# Patient Record
Sex: Male | Born: 1975
Health system: Southern US, Community
[De-identification: ages and names within clinical notes are randomized; demographics above are authoritative.]

## PROBLEM LIST (undated history)

## (undated) DIAGNOSIS — E785 Hyperlipidemia, unspecified: Secondary | ICD-10-CM

## (undated) DIAGNOSIS — T7840XA Allergy, unspecified, initial encounter: Secondary | ICD-10-CM

## (undated) HISTORY — DX: Hyperlipidemia, unspecified: E78.5

---

## 2004-10-13 ENCOUNTER — Emergency Department: Payer: Self-pay | Admitting: Unknown Physician Specialty

## 2004-10-20 ENCOUNTER — Emergency Department: Payer: Self-pay | Admitting: Emergency Medicine

## 2005-07-02 ENCOUNTER — Ambulatory Visit: Payer: Self-pay

## 2005-08-01 ENCOUNTER — Emergency Department: Payer: Self-pay | Admitting: Emergency Medicine

## 2005-08-02 ENCOUNTER — Inpatient Hospital Stay (HOSPITAL_COMMUNITY): Admission: AD | Admit: 2005-08-02 | Discharge: 2005-08-03 | Payer: Self-pay | Admitting: Surgery

## 2011-07-25 ENCOUNTER — Emergency Department: Payer: Self-pay | Admitting: Emergency Medicine
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
## 2019-08-27 DIAGNOSIS — H524 Presbyopia: Secondary | ICD-10-CM | POA: Diagnosis not present

## 2019-09-23 ENCOUNTER — Ambulatory Visit: Payer: Self-pay

## 2019-09-23 DIAGNOSIS — Z23 Encounter for immunization: Secondary | ICD-10-CM

## 2019-12-30 DIAGNOSIS — D2310 Other benign neoplasm of skin of unspecified eyelid, including canthus: Secondary | ICD-10-CM | POA: Diagnosis not present

## 2020-01-19 DIAGNOSIS — J111 Influenza due to unidentified influenza virus with other respiratory manifestations: Secondary | ICD-10-CM | POA: Diagnosis not present

## 2020-01-19 DIAGNOSIS — Z20822 Contact with and (suspected) exposure to covid-19: Secondary | ICD-10-CM | POA: Diagnosis not present

## 2020-01-19 DIAGNOSIS — R509 Fever, unspecified: Secondary | ICD-10-CM | POA: Diagnosis not present

## 2020-04-13 DIAGNOSIS — D2310 Other benign neoplasm of skin of unspecified eyelid, including canthus: Secondary | ICD-10-CM | POA: Diagnosis not present

## 2020-05-29 ENCOUNTER — Other Ambulatory Visit: Payer: Self-pay | Admitting: Physician Assistant

## 2020-05-29 ENCOUNTER — Other Ambulatory Visit: Payer: Self-pay

## 2020-05-29 DIAGNOSIS — J309 Allergic rhinitis, unspecified: Secondary | ICD-10-CM

## 2020-05-29 MED ORDER — MONTELUKAST SODIUM 10 MG PO TABS
10.0000 mg | ORAL_TABLET | Freq: Every day | ORAL | 0 refills | Status: DC
Start: 2020-05-29 — End: 2021-05-29

## 2020-05-29 MED ORDER — MONTELUKAST SODIUM 10 MG PO TABS: 10.0000 mg | ORAL_TABLET | Freq: Every day | ORAL | 0 refills | Status: DC

## 2020-05-29 MED ORDER — LEVOCETIRIZINE DIHYDROCHLORIDE 5 MG PO TABS: 5.0000 mg | ORAL_TABLET | Freq: Every evening | ORAL | 5 refills | Status: DC

## 2020-05-29 MED ORDER — LEVOCETIRIZINE DIHYDROCHLORIDE 5 MG PO TABS: 5.0000 mg | ORAL_TABLET | Freq: Every evening | ORAL | 0 refills | Status: DC

## 2020-06-12 NOTE — Progress Notes (Signed)
Scheduled to complete physical 06/20/20 with Randel Pigg, PA-C.  AMD

## 2020-06-13 ENCOUNTER — Ambulatory Visit: Payer: Self-pay

## 2020-06-13 ENCOUNTER — Other Ambulatory Visit: Payer: Self-pay

## 2020-06-13 DIAGNOSIS — Z0000 Encounter for general adult medical examination without abnormal findings: Secondary | ICD-10-CM

## 2020-06-13 LAB — POCT URINALYSIS DIPSTICK
Bilirubin, UA: NEGATIVE
Blood, UA: NEGATIVE
Glucose, UA: NEGATIVE
Ketones, UA: NEGATIVE
Leukocytes, UA: NEGATIVE
Nitrite, UA: NEGATIVE
Protein, UA: NEGATIVE
Spec Grav, UA: 1.025 (ref 1.010–1.025)
Urobilinogen, UA: 0.2 E.U./dL
pH, UA: 6 (ref 5.0–8.0)

## 2020-06-14 LAB — CMP12+LP+TP+TSH+6AC+PSA+CBC…
ALT: 24 IU/L (ref 0–44)
AST: 23 IU/L (ref 0–40)
Albumin/Globulin Ratio: 1.7 (ref 1.2–2.2)
Albumin: 4.5 g/dL (ref 4.0–5.0)
Alkaline Phosphatase: 80 IU/L (ref 48–121)
BUN/Creatinine Ratio: 13 (ref 9–20)
BUN: 17 mg/dL (ref 6–24)
Basophils Absolute: 0.1 10*3/uL (ref 0.0–0.2)
Basos: 1 %
Bilirubin Total: 0.4 mg/dL (ref 0.0–1.2)
Calcium: 9.4 mg/dL (ref 8.7–10.2)
Chloride: 105 mmol/L (ref 96–106)
Chol/HDL Ratio: 4.7 ratio (ref 0.0–5.0)
Cholesterol, Total: 242 mg/dL — ABNORMAL HIGH (ref 100–199)
Creatinine, Ser: 1.35 mg/dL — ABNORMAL HIGH (ref 0.76–1.27)
EOS (ABSOLUTE): 0.2 10*3/uL (ref 0.0–0.4)
Eos: 3 %
Estimated CHD Risk: 0.9 times avg. (ref 0.0–1.0)
Free Thyroxine Index: 1.3 (ref 1.2–4.9)
GFR calc Af Amer: 73 mL/min/{1.73_m2} (ref >59)
GFR calc non Af Amer: 63 mL/min/{1.73_m2} (ref >59)
GGT: 25 IU/L (ref 0–65)
Globulin, Total: 2.6 g/dL (ref 1.5–4.5)
Glucose: 99 mg/dL (ref 65–99)
HDL: 52 mg/dL (ref >39)
Hematocrit: 46.2 % (ref 37.5–51.0)
Hemoglobin: 15.7 g/dL (ref 13.0–17.7)
Immature Grans (Abs): 0 10*3/uL (ref 0.0–0.1)
Immature Granulocytes: 0 %
Iron: 112 ug/dL (ref 38–169)
LDH: 254 IU/L — ABNORMAL HIGH (ref 121–224)
LDL Chol Calc (NIH): 173 mg/dL — ABNORMAL HIGH (ref 0–99)
Lymphocytes Absolute: 1.2 10*3/uL (ref 0.7–3.1)
Lymphs: 18 %
MCH: 30.3 pg (ref 26.6–33.0)
MCHC: 34 g/dL (ref 31.5–35.7)
MCV: 89 fL (ref 79–97)
Monocytes Absolute: 0.7 10*3/uL (ref 0.1–0.9)
Monocytes: 11 %
Neutrophils Absolute: 4.3 10*3/uL (ref 1.4–7.0)
Neutrophils: 67 %
Phosphorus: 3.5 mg/dL (ref 2.8–4.1)
Platelets: 213 10*3/uL (ref 150–450)
Potassium: 4.7 mmol/L (ref 3.5–5.2)
Prostate Specific Ag, Serum: 1.1 ng/mL (ref 0.0–4.0)
RBC: 5.19 x10E6/uL (ref 4.14–5.80)
RDW: 14.1 % (ref 11.6–15.4)
Sodium: 140 mmol/L (ref 134–144)
T3 Uptake Ratio: 24 % (ref 24–39)
T4, Total: 5.3 ug/dL (ref 4.5–12.0)
TSH: 1.94 u[IU]/mL (ref 0.450–4.500)
Total Protein: 7.1 g/dL (ref 6.0–8.5)
Triglycerides: 96 mg/dL (ref 0–149)
Uric Acid: 6.4 mg/dL (ref 3.8–8.4)
VLDL Cholesterol Cal: 17 mg/dL (ref 5–40)
WBC: 6.4 10*3/uL (ref 3.4–10.8)

## 2020-06-20 ENCOUNTER — Other Ambulatory Visit: Payer: Self-pay

## 2020-06-20 ENCOUNTER — Encounter: Payer: Self-pay | Admitting: Physician Assistant

## 2020-06-20 ENCOUNTER — Ambulatory Visit: Payer: Self-pay | Admitting: Physician Assistant

## 2020-06-20 VITALS — BP 120/86 | HR 86 | Temp 98.4°F | Resp 16 | Ht 69.0 in | Wt 210.0 lb

## 2020-06-20 DIAGNOSIS — Z0000 Encounter for general adult medical examination without abnormal findings: Secondary | ICD-10-CM

## 2020-06-20 NOTE — Progress Notes (Signed)
   Subjective: Annual physical exam    Patient ID: Connor Rodriguez, male    DOB: August 07, 1976, 44 y.o.   MRN: [id number]  HPI Patient presents for annual physical exam voices no concerns or complaints.   Review of Systems    Seasonal rhinitis Objective:   Physical Exam No acute distress.  BMI is 31.01.  HEENT is unremarkable.  Neck is supple without adenopathy or bruits.  Lungs are clear to auscultation.  Heart regular rate and rhythm.  Abdomen slightly distended, negative HSM, normoactive bowel sounds, soft, and nontender to palpation.  No obvious deformity to the upper or lower extremities.  Patient has full and equal range of motion of the upper and lower extremities.  No cervical or lumbar spine deformity.  Patient has full and equal range of motion of the cervical and lumbar spine.  Cranial nerves II through XII are grossly intact.       Assessment & Plan: Well exam  Discussed lab results with patient advised follow-up as necessary.

## 2020-06-24 ENCOUNTER — Other Ambulatory Visit: Payer: Self-pay | Admitting: Physician Assistant

## 2020-09-13 DIAGNOSIS — H524 Presbyopia: Secondary | ICD-10-CM | POA: Diagnosis not present

## 2020-11-06 DIAGNOSIS — R7301 Impaired fasting glucose: Secondary | ICD-10-CM | POA: Insufficient documentation

## 2020-11-06 DIAGNOSIS — J309 Allergic rhinitis, unspecified: Secondary | ICD-10-CM

## 2020-11-06 DIAGNOSIS — E785 Hyperlipidemia, unspecified: Secondary | ICD-10-CM | POA: Insufficient documentation

## 2020-11-06 HISTORY — DX: Allergic rhinitis, unspecified: J30.9

## 2020-11-08 ENCOUNTER — Ambulatory Visit: Payer: Self-pay

## 2020-11-08 DIAGNOSIS — Z23 Encounter for immunization: Secondary | ICD-10-CM

## 2021-05-28 ENCOUNTER — Telehealth: Payer: Self-pay

## 2021-05-28 NOTE — Telephone Encounter (Signed)
Called pt to change 6/7 appt to a virtual no answer vm full

## 2021-05-29 ENCOUNTER — Telehealth (INDEPENDENT_AMBULATORY_CARE_PROVIDER_SITE_OTHER): Payer: 59 | Admitting: Nurse Practitioner

## 2021-05-29 ENCOUNTER — Other Ambulatory Visit: Payer: Self-pay

## 2021-05-29 ENCOUNTER — Encounter: Payer: Self-pay | Admitting: Nurse Practitioner

## 2021-05-29 VITALS — Ht 69.0 in | Wt 200.0 lb

## 2021-05-29 DIAGNOSIS — G479 Sleep disorder, unspecified: Secondary | ICD-10-CM

## 2021-05-29 DIAGNOSIS — B351 Tinea unguium: Secondary | ICD-10-CM | POA: Diagnosis not present

## 2021-05-29 DIAGNOSIS — R7301 Impaired fasting glucose: Secondary | ICD-10-CM

## 2021-05-29 DIAGNOSIS — Z7689 Persons encountering health services in other specified circumstances: Secondary | ICD-10-CM

## 2021-05-29 DIAGNOSIS — J309 Allergic rhinitis, unspecified: Secondary | ICD-10-CM

## 2021-05-29 DIAGNOSIS — E782 Mixed hyperlipidemia: Secondary | ICD-10-CM | POA: Diagnosis not present

## 2021-05-29 DIAGNOSIS — J302 Other seasonal allergic rhinitis: Secondary | ICD-10-CM | POA: Diagnosis not present

## 2021-05-29 MED ORDER — LEVOCETIRIZINE DIHYDROCHLORIDE 5 MG PO TABS: 5.0000 mg | ORAL_TABLET | Freq: Every evening | ORAL | 0 refills | Status: DC

## 2021-05-29 MED ORDER — MONTELUKAST SODIUM 10 MG PO TABS: 10.0000 mg | ORAL_TABLET | Freq: Every day | ORAL | 0 refills | Status: DC

## 2021-05-29 NOTE — Assessment & Plan Note (Signed)
Refills given. Return to clinic if symptoms worsen.

## 2021-05-29 NOTE — Progress Notes (Signed)
Ht 5\' 9"  (1.753 m)   Wt 200 lb (90.7 kg)   BMI 29.53 kg/m    Subjective:    Patient ID: Connor Rodriguez, male    DOB: [birth year], 45 y.o.   MRN: [id number]  HPI: Connor Rodriguez is a 45 y.o. male  Chief Complaint  Patient presents with  . Establish Care    Pt states she wants to discuss possible sleep apnea, podiatry for possible nail fungus as well    Patient presents to clinic to establish care with new PCP.  Patient reports a history of Impaired fasting glucose, hypercholesterolemia, and seasonal allergies. Patient states he would like to see a podiatrist for possible fungus on his feet.   SLEEP PROBLEM Patient states that he wakes up a couple of times at night gasping for area.  He is concerned that he might have sleep apnea and requesting a sleep study.   Patient denies a history of: Hypertension, Thyroid problems, Depression, Anxiety, Neurological problems, and Abdominal problems.   Denies HA, CP, SOB, dizziness, palpitations, visual changes, and lower extremity swelling.  Relevant past medical, surgical, family and social history reviewed and updated as indicated. Interim medical history since our last visit reviewed. Allergies and medications reviewed and updated.  Review of Systems  Eyes: Negative for visual disturbance.  Respiratory: Negative for shortness of breath.   Cardiovascular: Negative for chest pain and leg swelling.  Skin:       Toenail fungus  Neurological: Negative for light-headedness and headaches.  Psychiatric/Behavioral: Positive for sleep disturbance.    Per HPI unless specifically indicated above     Objective:    Ht 5\' 9"  (1.753 m)   Wt 200 lb (90.7 kg)   BMI 29.53 kg/m   Wt Readings from Last 3 Encounters:  05/29/21 200 lb (90.7 kg)  06/20/20 210 lb (95.3 kg)    Physical Exam Vitals and nursing note reviewed.  Constitutional:      General: He is not in acute distress.    Appearance: He is not ill-appearing.  HENT:     Head:  Normocephalic.     Right Ear: Hearing normal.     Left Ear: Hearing normal.     Nose: Nose normal.  Pulmonary:     Effort: Pulmonary effort is normal. No respiratory distress.  Neurological:     Mental Status: He is alert.  Psychiatric:        Mood and Affect: Mood normal.        Behavior: Behavior normal.        Thought Content: Thought content normal.        Judgment: Judgment normal.     Results for orders placed or performed in visit on 06/13/20  CMP12+LP+TP+TSH+6AC+PSA+CBC.  Result Value Ref Range   Glucose 99 65 - 99 mg/dL   Uric Acid 6.4 3.8 - 8.4 mg/dL   BUN 17 6 - 24 mg/dL   Creatinine, Ser 1.35 (H) 0.76 - 1.27 mg/dL   GFR calc non Af Amer 63 >59 mL/min/1.73   GFR calc Af Amer 73 >59 mL/min/1.73   BUN/Creatinine Ratio 13 9 - 20   Sodium 140 134 - 144 mmol/L   Potassium 4.7 3.5 - 5.2 mmol/L   Chloride 105 96 - 106 mmol/L   Calcium 9.4 8.7 - 10.2 mg/dL   Phosphorus 3.5 2.8 - 4.1 mg/dL   Total Protein 7.1 6.0 - 8.5 g/dL   Albumin 4.5 4.0 - 5.0 g/dL   Globulin, Total 2.6  1.5 - 4.5 g/dL   Albumin/Globulin Ratio 1.7 1.2 - 2.2   Bilirubin Total 0.4 0.0 - 1.2 mg/dL   Alkaline Phosphatase 80 48 - 121 IU/L   LDH 254 (H) 121 - 224 IU/L   AST 23 0 - 40 IU/L   ALT 24 0 - 44 IU/L   GGT 25 0 - 65 IU/L   Iron 112 38 - 169 ug/dL   Cholesterol, Total 242 (H) 100 - 199 mg/dL   Triglycerides 96 0 - 149 mg/dL   HDL 52 >39 mg/dL   VLDL Cholesterol Cal 17 5 - 40 mg/dL   LDL Chol Calc (NIH) 173 (H) 0 - 99 mg/dL   Chol/HDL Ratio 4.7 0.0 - 5.0 ratio   Estimated CHD Risk 0.9 0.0 - 1.0 times avg.   TSH 1.940 0.450 - 4.500 uIU/mL   T4, Total 5.3 4.5 - 12.0 ug/dL   T3 Uptake Ratio 24 24 - 39 %   Free Thyroxine Index 1.3 1.2 - 4.9   Prostate Specific Ag, Serum 1.1 0.0 - 4.0 ng/mL   WBC 6.4 3.4 - 10.8 x10E3/uL   RBC 5.19 4.14 - 5.80 x10E6/uL   Hemoglobin 15.7 13.0 - 17.7 g/dL   Hematocrit 46.2 37.5 - 51.0 %   MCV 89 79 - 97 fL   MCH 30.3 26.6 - 33.0 pg   MCHC 34.0 31.5 - 35.7  g/dL   RDW 14.1 11.6 - 15.4 %   Platelets 213 150 - 450 x10E3/uL   Neutrophils 67 Not Estab. %   Lymphs 18 Not Estab. %   Monocytes 11 Not Estab. %   Eos 3 Not Estab. %   Basos 1 Not Estab. %   Neutrophils Absolute 4.3 1.4 - 7.0 x10E3/uL   Lymphocytes Absolute 1.2 0.7 - 3.1 x10E3/uL   Monocytes Absolute 0.7 0.1 - 0.9 x10E3/uL   EOS (ABSOLUTE) 0.2 0.0 - 0.4 x10E3/uL   Basophils Absolute 0.1 0.0 - 0.2 x10E3/uL   Immature Granulocytes 0 Not Estab. %   Immature Grans (Abs) 0.0 0.0 - 0.1 x10E3/uL  POCT urinalysis dipstick  Result Value Ref Range   Color, UA Yellow    Clarity, UA Clear    Glucose, UA Negative Negative   Bilirubin, UA Negative    Ketones, UA Negative    Spec Grav, UA 1.025 1.010 - 1.025   Blood, UA Negative    pH, UA 6.0 5.0 - 8.0   Protein, UA Negative Negative   Urobilinogen, UA 0.2 0.2 or 1.0 E.U./dL   Nitrite, UA Negative    Leukocytes, UA Negative Negative   Appearance     Odor        Assessment & Plan:   Problem List Items Addressed This Visit      Respiratory   Allergic rhinitis    Refills given. Return to clinic if symptoms worsen.      Relevant Medications   levocetirizine (XYZAL) 5 MG tablet   montelukast (SINGULAIR) 10 MG tablet     Endocrine   Impaired fasting glucose (pre-diabetes)  - Primary    Chronic. Stable. Will daw labs at next visit.         Other   Hyperlipidemia    Chronic. Stable. Will daw labs at next visit.        Other Visit Diagnoses    Sleep disturbance       Order for a sleep study placed. Will make recommendations based on the slepe study.    Relevant Orders  Ambulatory referral to Sleep Studies   Toenail fungus       Referral placed for podiatry.   Relevant Orders   Ambulatory referral to Podiatry   Encounter to establish care       Return in 1 month for a physical and fasting labs.        Follow up plan: Return in about 1 month (around 06/28/2021) for Physical and Fasting labs.    This visit was  completed via MyChart due to the restrictions of the COVID-19 pandemic. All issues as above were discussed and addressed. Physical exam was done as above through visual confirmation on MyChart. If it was felt that the patient should be evaluated in the office, they were directed there. The patient verbally consented to this visit. 1. Location of the patient: Home 2. Location of the provider: Provider's Home 3. Those involved with this call:  ? Provider: Jon Billings, NP ? CMA: Yvonna Alanis, CMA ? Front Desk/Registration: [facility] Side 4. Time spent on call: 20 minutes with patient face to face via video conference. More than 50% of this time was spent in counseling and coordination of care. 30 minutes total spent in review of patient's record and preparation of their chart.

## 2021-05-29 NOTE — Assessment & Plan Note (Signed)
Chronic. Stable. Will daw labs at next visit.

## 2021-06-20 ENCOUNTER — Encounter: Payer: Self-pay | Admitting: Podiatry

## 2021-06-20 ENCOUNTER — Other Ambulatory Visit: Payer: Self-pay

## 2021-06-20 ENCOUNTER — Ambulatory Visit (INDEPENDENT_AMBULATORY_CARE_PROVIDER_SITE_OTHER): Payer: 59 | Admitting: Podiatry

## 2021-06-20 DIAGNOSIS — B351 Tinea unguium: Secondary | ICD-10-CM

## 2021-06-20 DIAGNOSIS — M2141 Flat foot [pes planus] (acquired), right foot: Secondary | ICD-10-CM

## 2021-06-20 DIAGNOSIS — M2142 Flat foot [pes planus] (acquired), left foot: Secondary | ICD-10-CM

## 2021-06-20 DIAGNOSIS — B353 Tinea pedis: Secondary | ICD-10-CM | POA: Diagnosis not present

## 2021-06-20 MED ORDER — KETOCONAZOLE 2 % EX CREA: 1.0000 "application " | TOPICAL_CREAM | Freq: Every day | CUTANEOUS | 2 refills | Status: DC

## 2021-06-20 MED ORDER — TERBINAFINE HCL 250 MG PO TABS: 250.0000 mg | ORAL_TABLET | Freq: Every day | ORAL | 0 refills | Status: AC

## 2021-06-20 NOTE — Progress Notes (Signed)
  Subjective:  Patient ID: Connor Rodriguez, male    DOB: 1976/05/25,  MRN: [id number]  Chief Complaint  Patient presents with   Nail Problem      NP - NAIL FUNGUS    45 y.o. male presents with the above complaint. History confirmed with patient.  Also is interested in arch supports he has [place] also know if they are good for him also has dry scaling skin around his heel  Objective:  Physical Exam: warm, good capillary refill, no trophic changes or ulcerative lesions, normal DP and PT pulses, and normal sensory exam.  He has onychomycosis of multiple toenails most notably the right hallux.  Dry scaling rash on the plantar foot and heel.  He has pes planus. Assessment:   1. Onychomycosis   2. Tinea pedis of both feet   3. Pes planus of both feet      Plan:  Patient was evaluated and treated and all questions answered.  Discussed the etiology and treatment options for tinea pedis and onychomycosis.  Discussed topical and oral treatment.  Recommended topical treatment with 2% ketoconazole cream and Lamisil 90-day course.  This was sent to the patient's pharmacy.  Also discussed appropriate foot hygiene, use of antifungal spray such as Tinactin in shoes, as well as cleaning foot surfaces such as showers and bathroom floors with bleach.  Evaluated is a inserts that he has and I think these are fine right now they are prefabricated deep heel cup arch supports.  When they begin to breakdown he will see [place] for power steps.  Return in about 4 months (around 10/20/2021) for follow up after nail fungus treatment.

## 2021-06-21 ENCOUNTER — Ambulatory Visit (INDEPENDENT_AMBULATORY_CARE_PROVIDER_SITE_OTHER): Payer: 59 | Admitting: Nurse Practitioner

## 2021-06-21 ENCOUNTER — Encounter: Payer: Self-pay | Admitting: Nurse Practitioner

## 2021-06-21 VITALS — BP 130/84 | HR 77 | Temp 97.1°F | Ht 68.0 in | Wt 204.8 lb

## 2021-06-21 DIAGNOSIS — R69 Illness, unspecified: Secondary | ICD-10-CM | POA: Diagnosis not present

## 2021-06-21 DIAGNOSIS — Z114 Encounter for screening for human immunodeficiency virus [HIV]: Secondary | ICD-10-CM

## 2021-06-21 DIAGNOSIS — Z1159 Encounter for screening for other viral diseases: Secondary | ICD-10-CM | POA: Diagnosis not present

## 2021-06-21 DIAGNOSIS — Z0000 Encounter for general adult medical examination without abnormal findings: Secondary | ICD-10-CM | POA: Diagnosis not present

## 2021-06-21 DIAGNOSIS — R7301 Impaired fasting glucose: Secondary | ICD-10-CM

## 2021-06-21 DIAGNOSIS — E782 Mixed hyperlipidemia: Secondary | ICD-10-CM | POA: Diagnosis not present

## 2021-06-21 LAB — URINALYSIS, ROUTINE W REFLEX MICROSCOPIC
Bilirubin, UA: NEGATIVE
Glucose, UA: NEGATIVE
Ketones, UA: NEGATIVE
Leukocytes,UA: NEGATIVE
Nitrite, UA: NEGATIVE
Protein,UA: NEGATIVE
RBC, UA: NEGATIVE
Specific Gravity, UA: 1.025 (ref 1.005–1.030)
Urobilinogen, Ur: 0.2 mg/dL (ref 0.2–1.0)
pH, UA: 5.5 (ref 5.0–7.5)

## 2021-06-21 NOTE — Assessment & Plan Note (Signed)
Chronic.  Controlled. Labs ordered today.  Will make recommendations based on lab results. Return to clinic in 6 months for reevaluation.  Call sooner if concerns arise.

## 2021-06-21 NOTE — Progress Notes (Signed)
BP 130/84   Pulse 77   Temp (!) 97.1 F (36.2 C) (Oral)   Ht 5\' 8"  (1.727 m)   Wt 204 lb 12.8 oz (92.9 kg)   SpO2 99%   BMI 31.14 kg/m    Subjective:    Patient ID: Connor Rodriguez, male    DOB: February 28, 1976, 45 y.o.   MRN: [id number]  HPI: Connor Rodriguez is a 45 y.o. male presenting on 06/21/2021 for comprehensive medical examination. Current medical complaints include:none  He currently lives with: Interim Problems from his last visit: no  HYPERLIPIDEMIA Hyperlipidemia status:  no medication Satisfied with current treatment?  yes Side effects:  no Medication compliance: poor compliance Past cholesterol meds: none Supplements: none Aspirin:  no The 10-year ASCVD risk score [name], et al., 2013) is: 4.7%   Values used to calculate the score:     Age: 79 years     Sex: Male     Is Non-Hispanic African American: Yes     Diabetic: No     Tobacco smoker: No     Systolic Blood Pressure: 115 mmHg     Is BP treated: No     HDL Cholesterol: 52 mg/dL     Total Cholesterol: 242 mg/dL Chest pain:  no Coronary artery disease:  no Family history CAD:  no Family history early CAD:  no   Denies HA, CP, SOB, dizziness, palpitations, visual changes, and lower extremity swelling.   Depression Screen done today and results listed below:  Depression screen [facility] 2/9 06/21/2021 05/29/2021  Decreased Interest 0 0  Down, Depressed, Hopeless 0 0  PHQ - 2 Score 0 0  Altered sleeping 2 -  Tired, decreased energy 1 -  Change in appetite 0 -  Feeling bad or failure about yourself  0 -  Trouble concentrating 0 -  Moving slowly or fidgety/restless 0 -  Suicidal thoughts 0 -  PHQ-9 Score 3 -    The patient does not have a history of falls. I did complete a risk assessment for falls. A plan of care for falls was documented.   Past Medical History:  Past Medical History:  Diagnosis Date   Allergic rhinitis 11/06/2020   Hyperlipidemia     Surgical History:  History reviewed.  No pertinent surgical history.  Medications:  Current Outpatient Medications on File Prior to Visit  Medication Sig   levocetirizine (XYZAL) 5 MG tablet Take 1 tablet (5 mg total) by mouth every evening.   terbinafine (LAMISIL) 250 MG tablet Take 1 tablet (250 mg total) by mouth daily.   ketoconazole (NIZORAL) 2 % cream Apply 1 application topically daily. (Patient not taking: Reported on 06/21/2021)   montelukast (SINGULAIR) 10 MG tablet Take 1 tablet (10 mg total) by mouth daily. (Patient not taking: Reported on 06/21/2021)   No current facility-administered medications on file prior to visit.    Allergies:  No Known Allergies  Social History:  Social History   Socioeconomic History   Marital status: Single    Spouse name: Not on file   Number of children: Not on file   Years of education: Not on file   Highest education level: Not on file  Occupational History   Not on file  Tobacco Use   Smoking status: Never   Smokeless tobacco: Never  Vaping Use   Vaping Use: Never used  Substance and Sexual Activity   Alcohol use: Not Currently   Drug use: Never   Sexual activity:  Yes  Other Topics Concern   Not on file  Social History Narrative   Not on file   Social Determinants of Health   Financial Resource Strain: Not on file  Food Insecurity: Not on file  Transportation Needs: Not on file  Physical Activity: Not on file  Stress: Not on file  Social Connections: Not on file  Intimate Partner Violence: Not on file   Social History   Tobacco Use  Smoking Status Never  Smokeless Tobacco Never   Social History   Substance and Sexual Activity  Alcohol Use Not Currently    Family History:  Family History  Problem Relation Age of Onset   Cancer Mother    Diabetes Father     Past medical history, surgical history, medications, allergies, family history and social history reviewed with patient today and changes made to appropriate areas of the chart.   Review  of Systems  Eyes:  Negative for blurred vision and double vision.  Respiratory:  Negative for shortness of breath.   Cardiovascular:  Negative for chest pain, palpitations and leg swelling.  Neurological:  Negative for dizziness and headaches.  All other ROS negative except what is listed above and in the HPI.      Objective:    BP 130/84   Pulse 77   Temp (!) 97.1 F (36.2 C) (Oral)   Ht 5\' 8"  (1.727 m)   Wt 204 lb 12.8 oz (92.9 kg)   SpO2 99%   BMI 31.14 kg/m   Wt Readings from Last 3 Encounters:  06/21/21 204 lb 12.8 oz (92.9 kg)  05/29/21 200 lb (90.7 kg)  06/20/20 210 lb (95.3 kg)    Physical Exam Vitals and nursing note reviewed.  Constitutional:      General: He is not in acute distress.    Appearance: Normal appearance. He is not ill-appearing, toxic-appearing or diaphoretic.  HENT:     Head: Normocephalic.     Right Ear: Tympanic membrane, ear canal and external ear normal.     Left Ear: Tympanic membrane, ear canal and external ear normal.     Nose: Nose normal. No congestion or rhinorrhea.     Mouth/Throat:     Mouth: Mucous membranes are moist.  Eyes:     General:        Right eye: No discharge.        Left eye: No discharge.     Extraocular Movements: Extraocular movements intact.     Conjunctiva/sclera: Conjunctivae normal.     Pupils: Pupils are equal, round, and reactive to light.  Cardiovascular:     Rate and Rhythm: Normal rate and regular rhythm.     Heart sounds: No murmur heard. Pulmonary:     Effort: Pulmonary effort is normal. No respiratory distress.     Breath sounds: Normal breath sounds. No wheezing, rhonchi or rales.  Abdominal:     General: Abdomen is flat. Bowel sounds are normal. There is no distension.     Palpations: Abdomen is soft.     Tenderness: There is no abdominal tenderness. There is no guarding.  Musculoskeletal:     Cervical back: Normal range of motion and neck supple.  Skin:    General: Skin is warm and dry.      Capillary Refill: Capillary refill takes less than 2 seconds.  Neurological:     General: No focal deficit present.     Mental Status: He is alert and oriented to person, place, and time.  Cranial Nerves: No cranial nerve deficit.     Motor: No weakness.     Deep Tendon Reflexes: Reflexes normal.  Psychiatric:        Mood and Affect: Mood normal.        Behavior: Behavior normal.        Thought Content: Thought content normal.        Judgment: Judgment normal.    Results for orders placed or performed in visit on 06/13/20  CMP12+LP+TP+TSH+6AC+PSA+CBC.  Result Value Ref Range   Glucose 99 65 - 99 mg/dL   Uric Acid 6.4 3.8 - 8.4 mg/dL   BUN 17 6 - 24 mg/dL   Creatinine, Ser 1.35 (H) 0.76 - 1.27 mg/dL   GFR calc non Af Amer 63 >59 mL/min/1.73   GFR calc Af Amer 73 >59 mL/min/1.73   BUN/Creatinine Ratio 13 9 - 20   Sodium 140 134 - 144 mmol/L   Potassium 4.7 3.5 - 5.2 mmol/L   Chloride 105 96 - 106 mmol/L   Calcium 9.4 8.7 - 10.2 mg/dL   Phosphorus 3.5 2.8 - 4.1 mg/dL   Total Protein 7.1 6.0 - 8.5 g/dL   Albumin 4.5 4.0 - 5.0 g/dL   Globulin, Total 2.6 1.5 - 4.5 g/dL   Albumin/Globulin Ratio 1.7 1.2 - 2.2   Bilirubin Total 0.4 0.0 - 1.2 mg/dL   Alkaline Phosphatase 80 48 - 121 IU/L   LDH 254 (H) 121 - 224 IU/L   AST 23 0 - 40 IU/L   ALT 24 0 - 44 IU/L   GGT 25 0 - 65 IU/L   Iron 112 38 - 169 ug/dL   Cholesterol, Total 242 (H) 100 - 199 mg/dL   Triglycerides 96 0 - 149 mg/dL   HDL 52 >39 mg/dL   VLDL Cholesterol Cal 17 5 - 40 mg/dL   LDL Chol Calc (NIH) 173 (H) 0 - 99 mg/dL   Chol/HDL Ratio 4.7 0.0 - 5.0 ratio   Estimated CHD Risk 0.9 0.0 - 1.0 times avg.   TSH 1.940 0.450 - 4.500 uIU/mL   T4, Total 5.3 4.5 - 12.0 ug/dL   T3 Uptake Ratio 24 24 - 39 %   Free Thyroxine Index 1.3 1.2 - 4.9   Prostate Specific Ag, Serum 1.1 0.0 - 4.0 ng/mL   WBC 6.4 3.4 - 10.8 x10E3/uL   RBC 5.19 4.14 - 5.80 x10E6/uL   Hemoglobin 15.7 13.0 - 17.7 g/dL   Hematocrit 46.2 37.5 - 51.0 %    MCV 89 79 - 97 fL   MCH 30.3 26.6 - 33.0 pg   MCHC 34.0 31.5 - 35.7 g/dL   RDW 14.1 11.6 - 15.4 %   Platelets 213 150 - 450 x10E3/uL   Neutrophils 67 Not Estab. %   Lymphs 18 Not Estab. %   Monocytes 11 Not Estab. %   Eos 3 Not Estab. %   Basos 1 Not Estab. %   Neutrophils Absolute 4.3 1.4 - 7.0 x10E3/uL   Lymphocytes Absolute 1.2 0.7 - 3.1 x10E3/uL   Monocytes Absolute 0.7 0.1 - 0.9 x10E3/uL   EOS (ABSOLUTE) 0.2 0.0 - 0.4 x10E3/uL   Basophils Absolute 0.1 0.0 - 0.2 x10E3/uL   Immature Granulocytes 0 Not Estab. %   Immature Grans (Abs) 0.0 0.0 - 0.1 x10E3/uL  POCT urinalysis dipstick  Result Value Ref Range   Color, UA Yellow    Clarity, UA Clear    Glucose, UA Negative Negative   Bilirubin, UA Negative  Ketones, UA Negative    Spec Grav, UA 1.025 1.010 - 1.025   Blood, UA Negative    pH, UA 6.0 5.0 - 8.0   Protein, UA Negative Negative   Urobilinogen, UA 0.2 0.2 or 1.0 E.U./dL   Nitrite, UA Negative    Leukocytes, UA Negative Negative   Appearance     Odor        Assessment & Plan:   Problem List Items Addressed This Visit       Endocrine   Impaired fasting glucose (pre-diabetes)     Chronic.  Controlled. Labs ordered today.  Will make recommendations based on lab results. Return to clinic in 6 months for reevaluation.  Call sooner if concerns arise.         Relevant Orders   HgB A1c     Other   Hyperlipidemia    Chronic.  Controlled. Labs ordered today.  Will make recommendations based on lab results. Return to clinic in 6 months for reevaluation.  Call sooner if concerns arise.        Relevant Orders   Lipid panel   Other Visit Diagnoses     Annual physical exam    -  Primary   Health maintenance reviewed during visit. Up to date on vaccines. Will wait on Colonoscopy due to insurance coverage.    Relevant Orders   TSH   PSA   Lipid panel   CBC with Differential/Platelet   Comprehensive metabolic panel   Urinalysis, Routine w reflex  microscopic   Encounter for hepatitis C screening test for low risk patient       Relevant Orders   Hepatitis C Antibody   Screening for HIV (human immunodeficiency virus)       Relevant Orders   HIV Antibody (routine testing w rflx)        Discussed aspirin prophylaxis for myocardial infarction prevention and decision was it was not indicated  LABORATORY TESTING:  Health maintenance labs ordered today as discussed above.   The natural history of prostate cancer and ongoing controversy regarding screening and potential treatment outcomes of prostate cancer has been discussed with the patient. The meaning of a false positive PSA and a false negative PSA has been discussed. He indicates understanding of the limitations of this screening test and wishes to proceed with screening PSA testing.   IMMUNIZATIONS:   - Tdap: Tetanus vaccination status reviewed: last tetanus booster within 10 years. - Influenza: Postponed to flu season - Pneumovax: Not applicable - Prevnar: Not applicable - HPV: Not applicable - Zostavax vaccine: Not applicable  SCREENING: - Colonoscopy:  will discuss at 48 due to insurance reasons   Discussed with patient purpose of the colonoscopy is to detect colon cancer at curable precancerous or early stages   - AAA Screening: Not applicable  -Hearing Test: Not applicable  -Spirometry: Not applicable   PATIENT COUNSELING:    Sexuality: Discussed sexually transmitted diseases, partner selection, use of condoms, avoidance of unintended pregnancy  and contraceptive alternatives.   Advised to avoid cigarette smoking.  I discussed with the patient that most people either abstain from alcohol or drink within safe limits (<=14/week and <=4 drinks/occasion for males, <=7/weeks and <= 3 drinks/occasion for females) and that the risk for alcohol disorders and other health effects rises proportionally with the number of drinks per week and how often a drinker exceeds daily  limits.  Discussed cessation/primary prevention of drug use and availability of treatment for abuse.   Diet:  Encouraged to adjust caloric intake to maintain  or achieve ideal body weight, to reduce intake of dietary saturated fat and total fat, to limit sodium intake by avoiding high sodium foods and not adding table salt, and to maintain adequate dietary potassium and calcium preferably from fresh fruits, vegetables, and low-fat dairy products.    stressed the importance of regular exercise  Injury prevention: Discussed safety belts, safety helmets, smoke detector, smoking near bedding or upholstery.   Dental health: Discussed importance of regular tooth brushing, flossing, and dental visits.   Follow up plan: NEXT PREVENTATIVE PHYSICAL DUE IN 1 YEAR. Return in about 6 months (around 12/21/2021) for HTN, HLD, DM2 FU.

## 2021-06-22 ENCOUNTER — Encounter: Payer: Self-pay | Admitting: Podiatry

## 2021-06-22 LAB — COMPREHENSIVE METABOLIC PANEL
ALT: 25 IU/L (ref 0–44)
AST: 22 IU/L (ref 0–40)
Albumin/Globulin Ratio: 1.7 (ref 1.2–2.2)
Albumin: 4 g/dL (ref 4.0–5.0)
Alkaline Phosphatase: 91 IU/L (ref 44–121)
BUN/Creatinine Ratio: 11 (ref 9–20)
BUN: 14 mg/dL (ref 6–24)
Bilirubin Total: <0.2 mg/dL (ref 0.0–1.2)
CO2: 24 mmol/L (ref 20–29)
Calcium: 9.2 mg/dL (ref 8.7–10.2)
Chloride: 101 mmol/L (ref 96–106)
Creatinine, Ser: 1.29 mg/dL — ABNORMAL HIGH (ref 0.76–1.27)
Globulin, Total: 2.3 g/dL (ref 1.5–4.5)
Glucose: 66 mg/dL (ref 65–99)
Potassium: 4.1 mmol/L (ref 3.5–5.2)
Sodium: 138 mmol/L (ref 134–144)
Total Protein: 6.3 g/dL (ref 6.0–8.5)
eGFR: 69 mL/min/{1.73_m2} (ref >59)

## 2021-06-22 LAB — LIPID PANEL
Chol/HDL Ratio: 4.1 ratio (ref 0.0–5.0)
Cholesterol, Total: 201 mg/dL — ABNORMAL HIGH (ref 100–199)
HDL: 49 mg/dL (ref >39)
LDL Chol Calc (NIH): 132 mg/dL — ABNORMAL HIGH (ref 0–99)
Triglycerides: 114 mg/dL (ref 0–149)
VLDL Cholesterol Cal: 20 mg/dL (ref 5–40)

## 2021-06-22 LAB — CBC WITH DIFFERENTIAL/PLATELET
Basophils Absolute: 0.1 10*3/uL (ref 0.0–0.2)
Basos: 1 %
EOS (ABSOLUTE): 0.2 10*3/uL (ref 0.0–0.4)
Eos: 3 %
Hematocrit: 43.4 % (ref 37.5–51.0)
Hemoglobin: 14.3 g/dL (ref 13.0–17.7)
Immature Grans (Abs): 0.1 10*3/uL (ref 0.0–0.1)
Immature Granulocytes: 1 %
Lymphocytes Absolute: 1.4 10*3/uL (ref 0.7–3.1)
Lymphs: 18 %
MCH: 30 pg (ref 26.6–33.0)
MCHC: 32.9 g/dL (ref 31.5–35.7)
MCV: 91 fL (ref 79–97)
Monocytes Absolute: 0.7 10*3/uL (ref 0.1–0.9)
Monocytes: 9 %
Neutrophils Absolute: 5.3 10*3/uL (ref 1.4–7.0)
Neutrophils: 68 %
Platelets: 239 10*3/uL (ref 150–450)
RBC: 4.77 x10E6/uL (ref 4.14–5.80)
RDW: 13.7 % (ref 11.6–15.4)
WBC: 7.7 10*3/uL (ref 3.4–10.8)

## 2021-06-22 LAB — PSA: Prostate Specific Ag, Serum: 1.3 ng/mL (ref 0.0–4.0)

## 2021-06-22 LAB — HEMOGLOBIN A1C
Est. average glucose Bld gHb Est-mCnc: 117 mg/dL
Hgb A1c MFr Bld: 5.7 % — ABNORMAL HIGH (ref 4.8–5.6)

## 2021-06-22 LAB — HEPATITIS C ANTIBODY: Hep C Virus Ab: <0.1 s/co ratio (ref 0.0–0.9)

## 2021-06-22 LAB — TSH: TSH: 0.676 u[IU]/mL (ref 0.450–4.500)

## 2021-06-22 LAB — HIV ANTIBODY (ROUTINE TESTING W REFLEX): HIV Screen 4th Generation wRfx: NONREACTIVE

## 2021-06-26 NOTE — Progress Notes (Signed)
Please let patient know that overall his lab work looks good.  Cholesterol has improved from 1 year ago.  Keep up the good work.  A1c remains well controlled at 5.7.    His kidney function is slightly elevated. I would like to repeat this next week to see if it remains elevated.  If so, we may need to see nephrology to find out why it is elevated.  I have placed the order for the repeat labs. Please make patient a lab appointment.

## 2021-06-26 NOTE — Addendum Note (Signed)
Addended by: Jon Billings on: 06/26/2021 08:13 AM   Modules accepted: Orders

## 2021-07-04 ENCOUNTER — Encounter: Payer: 59 | Admitting: Nurse Practitioner

## 2021-07-10 ENCOUNTER — Other Ambulatory Visit: Payer: Self-pay

## 2021-07-10 ENCOUNTER — Other Ambulatory Visit: Payer: 59

## 2021-07-10 DIAGNOSIS — Z0000 Encounter for general adult medical examination without abnormal findings: Secondary | ICD-10-CM

## 2021-07-10 DIAGNOSIS — R7301 Impaired fasting glucose: Secondary | ICD-10-CM | POA: Diagnosis not present

## 2021-07-10 DIAGNOSIS — E782 Mixed hyperlipidemia: Secondary | ICD-10-CM

## 2021-07-11 LAB — COMPREHENSIVE METABOLIC PANEL
ALT: 14 IU/L (ref 0–44)
AST: 15 IU/L (ref 0–40)
Albumin/Globulin Ratio: 1.7 (ref 1.2–2.2)
Albumin: 4.1 g/dL (ref 4.0–5.0)
Alkaline Phosphatase: 84 IU/L (ref 44–121)
BUN/Creatinine Ratio: 10 (ref 9–20)
BUN: 12 mg/dL (ref 6–24)
Bilirubin Total: 0.2 mg/dL (ref 0.0–1.2)
CO2: 24 mmol/L (ref 20–29)
Calcium: 9 mg/dL (ref 8.7–10.2)
Chloride: 103 mmol/L (ref 96–106)
Creatinine, Ser: 1.24 mg/dL (ref 0.76–1.27)
Globulin, Total: 2.4 g/dL (ref 1.5–4.5)
Glucose: 98 mg/dL (ref 65–99)
Potassium: 4 mmol/L (ref 3.5–5.2)
Sodium: 139 mmol/L (ref 134–144)
Total Protein: 6.5 g/dL (ref 6.0–8.5)
eGFR: 73 mL/min/{1.73_m2} (ref >59)

## 2021-07-11 NOTE — Progress Notes (Signed)
Hi Connor Rodriguez.  Your kidney function returned to normal.  This is great news.  We will continue to monitor this in the future.

## 2021-08-26 ENCOUNTER — Other Ambulatory Visit: Payer: Self-pay | Admitting: Nurse Practitioner

## 2021-08-26 DIAGNOSIS — J309 Allergic rhinitis, unspecified: Secondary | ICD-10-CM

## 2021-08-26 NOTE — Telephone Encounter (Signed)
Requested Prescriptions  Pending Prescriptions Disp Refills  . montelukast (SINGULAIR) 10 MG tablet [Pharmacy Med Name: MONTELUKAST SOD 10 MG TABLET] 90 tablet 0    Sig: TAKE 1 TABLET BY MOUTH EVERY DAY     Pulmonology:  Leukotriene Inhibitors Passed - 08/26/2021  9:06 AM      Passed - Valid encounter within last 12 months    Recent Outpatient Visits          2 months ago Annual physical exam   [place], NP   2 months ago Impaired fasting glucose (pre-diabetes)   [place], NP      Future Appointments            In 3 months Jon Billings, NP [facility], [facility]           . levocetirizine (XYZAL) 5 MG tablet [Pharmacy Med Name: LEVOCETIRIZINE 5 MG TABLET] 90 tablet 0    Sig: TAKE 1 TABLET BY MOUTH EVERY DAY IN THE EVENING     Ear, Nose, and Throat:  Antihistamines Passed - 08/26/2021  9:06 AM      Passed - Valid encounter within last 12 months    Recent Outpatient Visits          2 months ago Annual physical exam   [place], NP   2 months ago Impaired fasting glucose (pre-diabetes)   [facility] Jon Billings, NP      Future Appointments            In 3 months Jon Billings, NP [facility], [place]

## 2021-10-22 ENCOUNTER — Encounter: Payer: 59 | Admitting: Podiatry

## 2021-12-21 ENCOUNTER — Ambulatory Visit: Payer: 59 | Admitting: Nurse Practitioner

## 2022-01-11 ENCOUNTER — Ambulatory Visit (INDEPENDENT_AMBULATORY_CARE_PROVIDER_SITE_OTHER): Payer: 59 | Admitting: Nurse Practitioner

## 2022-01-11 ENCOUNTER — Encounter: Payer: Self-pay | Admitting: Nurse Practitioner

## 2022-01-11 ENCOUNTER — Other Ambulatory Visit: Payer: Self-pay

## 2022-01-11 VITALS — BP 124/78 | HR 81 | Temp 98.3°F | Ht 68.3 in | Wt 212.8 lb

## 2022-01-11 DIAGNOSIS — R7301 Impaired fasting glucose: Secondary | ICD-10-CM

## 2022-01-11 DIAGNOSIS — E782 Mixed hyperlipidemia: Secondary | ICD-10-CM | POA: Diagnosis not present

## 2022-01-11 NOTE — Assessment & Plan Note (Signed)
Labs ordered today.  Will make recommendations based on lab results. ?

## 2022-01-11 NOTE — Assessment & Plan Note (Signed)
Labs ordered today. Will make recommendations based on lab results.  Patient not currently on cholesterol medication.

## 2022-01-11 NOTE — Progress Notes (Signed)
BP 124/78    Pulse 81    Temp 98.3 F (36.8 C) (Oral)    Ht 5' 8.3" (1.735 m)    Wt 212 lb 12.8 oz (96.5 kg)    SpO2 97%    BMI 32.07 kg/m    Subjective:    Patient ID: Connor Rodriguez, male    DOB: 03-Jan-1976, 46 y.o.   MRN: [id number]  HPI: Connor Rodriguez is a 46 y.o. male  Chief Complaint  Patient presents with   Diabetes   Hyperlipidemia   Hypertension   HYPERLIPIDEMIA Hyperlipidemia status: no medication Satisfied with current treatment?  yes Side effects:  no Medication compliance: Not taking any medication Past cholesterol meds: none Supplements: none Aspirin:  no The 10-year ASCVD risk score Connor Rodriguez) is: 4.7%   Values used to calculate the score:     Age: 77 years     Sex: Male     Is Non-Hispanic African American: Yes     Diabetic: No     Tobacco smoker: No     Systolic Blood Pressure: 858 mmHg     Is BP treated: No     HDL Cholesterol: 52 mg/dL     Total Cholesterol: 242 mg/dL Chest pain:  no Coronary artery disease:  no Family history CAD:  no Family history early CAD:  no   Denies HA, CP, SOB, dizziness, palpitations, visual changes, and lower extremity swelling. Relevant past medical, surgical, family and social history reviewed and updated as indicated. Interim medical history since our last visit reviewed. Allergies and medications reviewed and updated.  Review of Systems  Eyes:  Negative for visual disturbance.  Respiratory:  Negative for chest tightness and shortness of breath.   Cardiovascular:  Negative for chest pain, palpitations and leg swelling.  Neurological:  Negative for dizziness, light-headedness and headaches.   Per HPI unless specifically indicated above     Objective:    BP 124/78    Pulse 81    Temp 98.3 F (36.8 C) (Oral)    Ht 5' 8.3" (1.735 m)    Wt 212 lb 12.8 oz (96.5 kg)    SpO2 97%    BMI 32.07 kg/m   Wt Readings from Last 3 Encounters:  01/11/22 212 lb 12.8 oz (96.5 kg)  06/21/21 204 lb 12.8 oz  (92.9 kg)  05/29/21 200 lb (90.7 kg)    Physical Exam Vitals and nursing note reviewed.  Constitutional:      General: He is not in acute distress.    Appearance: Normal appearance. He is not ill-appearing, toxic-appearing or diaphoretic.  HENT:     Head: Normocephalic.     Right Ear: External ear normal.     Left Ear: External ear normal.     Nose: Nose normal. No congestion or rhinorrhea.     Mouth/Throat:     Mouth: Mucous membranes are moist.  Eyes:     General:        Right eye: No discharge.        Left eye: No discharge.     Extraocular Movements: Extraocular movements intact.     Conjunctiva/sclera: Conjunctivae normal.     Pupils: Pupils are equal, round, and reactive to light.  Cardiovascular:     Rate and Rhythm: Normal rate and regular rhythm.     Heart sounds: No murmur heard. Pulmonary:     Effort: Pulmonary effort is normal. No respiratory distress.  sounds: Normal breath sounds. No wheezing, rhonchi or rales.  °Abdominal:  °   General: Abdomen is flat. Bowel sounds are normal.  °Musculoskeletal:  °   Cervical back: Normal range of motion and neck supple.  °Skin: °   General: Skin is warm and dry.  °   Capillary Refill: Capillary refill takes less than 2 seconds.  °Neurological:  °   General: No focal deficit present.  °   Mental Status: He is alert and oriented to person, place, and time.  °Psychiatric:     °   Mood and Affect: Mood normal.     °   Behavior: Behavior normal.     °   Thought Content: Thought content normal.     °   Judgment: Judgment normal.  ° ° °Results for orders placed or performed in visit on 07/10/21  °Comp Met (CMET)  °Result Value Ref Range  ° Glucose 98 65 - 99 mg/dL  ° BUN 12 6 - 24 mg/dL  ° Creatinine, Ser 1.24 0.76 - 1.27 mg/dL  ° eGFR 73 >59 mL/min/1.73  ° BUN/Creatinine Ratio 10 9 - 20  ° Sodium 139 134 - 144 mmol/L  ° Potassium 4.0 3.5 - 5.2 mmol/L  ° Chloride 103 96 - 106 mmol/L  ° CO2 24 20 - 29 mmol/L  ° Calcium 9.0 8.7 - 10.2  mg/dL  ° Total Protein 6.5 6.0 - 8.5 g/dL  ° Albumin 4.1 4.0 - 5.0 g/dL  ° Globulin, Total 2.4 1.5 - 4.5 g/dL  ° Albumin/Globulin Ratio 1.7 1.2 - 2.2  ° Bilirubin Total 0.2 0.0 - 1.2 mg/dL  ° Alkaline Phosphatase 84 44 - 121 IU/L  ° AST 15 0 - 40 IU/L  ° ALT 14 0 - 44 IU/L  ° °   °Assessment & Plan:  ° °Problem List Items Addressed This Visit   ° °  ° Endocrine  ° Impaired fasting glucose (pre-diabetes)   °  Labs ordered today. Will make recommendations based on lab results.  °  °  ° Relevant Orders  ° Comp Met (CMET)  ° HgB A1c  °  ° Other  ° Hyperlipidemia - Primary  °  Labs ordered today. Will make recommendations based on lab results.  Patient not currently on cholesterol medication.  °  °  ° Relevant Orders  ° Comp Met (CMET)  ° Lipid Profile  °  ° °Follow up plan: °Return in about 6 months (around 07/11/2022) for Physical and Fasting labs. ° ° ° ° ° ° °

## 2022-01-12 LAB — COMPREHENSIVE METABOLIC PANEL
ALT: 26 IU/L (ref 0–44)
AST: 20 IU/L (ref 0–40)
Albumin/Globulin Ratio: 1.6 (ref 1.2–2.2)
Albumin: 4.2 g/dL (ref 4.0–5.0)
Alkaline Phosphatase: 81 IU/L (ref 44–121)
BUN/Creatinine Ratio: 10 (ref 9–20)
BUN: 12 mg/dL (ref 6–24)
Bilirubin Total: 0.2 mg/dL (ref 0.0–1.2)
CO2: 25 mmol/L (ref 20–29)
Calcium: 9.5 mg/dL (ref 8.7–10.2)
Chloride: 105 mmol/L (ref 96–106)
Creatinine, Ser: 1.23 mg/dL (ref 0.76–1.27)
Globulin, Total: 2.6 g/dL (ref 1.5–4.5)
Glucose: 93 mg/dL (ref 70–99)
Potassium: 5.3 mmol/L — ABNORMAL HIGH (ref 3.5–5.2)
Sodium: 143 mmol/L (ref 134–144)
Total Protein: 6.8 g/dL (ref 6.0–8.5)
eGFR: 73 mL/min/{1.73_m2} (ref >59)

## 2022-01-12 LAB — HEMOGLOBIN A1C
Est. average glucose Bld gHb Est-mCnc: 123 mg/dL
Hgb A1c MFr Bld: 5.9 % — ABNORMAL HIGH (ref 4.8–5.6)

## 2022-01-12 LAB — LIPID PANEL
Chol/HDL Ratio: 5.3 ratio — ABNORMAL HIGH (ref 0.0–5.0)
Cholesterol, Total: 227 mg/dL — ABNORMAL HIGH (ref 100–199)
HDL: 43 mg/dL
LDL Chol Calc (NIH): 145 mg/dL — ABNORMAL HIGH (ref 0–99)
Triglycerides: 215 mg/dL — ABNORMAL HIGH (ref 0–149)
VLDL Cholesterol Cal: 39 mg/dL (ref 5–40)

## 2022-01-14 NOTE — Progress Notes (Signed)
Please let patient know his lab work shows that his cholesterol is elevated from prior.  Recommend a low fat diet and exercise.  Please let me know if he has any questions.  We will continue to monitor this in the future.

## 2022-01-21 ENCOUNTER — Encounter: Payer: Self-pay | Admitting: Nurse Practitioner

## 2022-01-21 ENCOUNTER — Ambulatory Visit (INDEPENDENT_AMBULATORY_CARE_PROVIDER_SITE_OTHER): Payer: 59 | Admitting: Nurse Practitioner

## 2022-01-21 ENCOUNTER — Other Ambulatory Visit: Payer: Self-pay

## 2022-01-21 VITALS — BP 129/80 | HR 78 | Temp 98.3°F | Ht 68.31 in | Wt 219.8 lb

## 2022-01-21 DIAGNOSIS — Z1211 Encounter for screening for malignant neoplasm of colon: Secondary | ICD-10-CM | POA: Diagnosis not present

## 2022-01-21 DIAGNOSIS — J302 Other seasonal allergic rhinitis: Secondary | ICD-10-CM | POA: Diagnosis not present

## 2022-01-21 NOTE — Progress Notes (Signed)
° °BP 129/80    Pulse 78    Temp 98.3 °F (36.8 °C) (Oral)    Ht 5' 8.31" (1.735 m)    Wt 219 lb 12.8 oz (99.7 kg)    SpO2 98%    BMI 33.12 kg/m²   ° °Subjective:  ° ° Patient ID: Connor Rodriguez, male    DOB: 07/16/1976, 45 y.o.   MRN: [id number] ° °HPI: °Connor Rodriguez is a 45 y.o. male ° °Chief Complaint  °Patient presents with  ° Colonoscopy  °  Patient wants to talk about getting a colonoscopy. Patient is trying to proactive.  ° ° °Patient presents to clinic to discuss getting a colonoscopy.  Patient is ready to get it done. Patient does not have a family history colon cancer.  Patient denies concerns just wants to be proactive. ° °Patient sates his allergies are well controlled on singulair.  Denies concerns at visit today.  ° ° Denies HA, CP, SOB, dizziness, palpitations, visual changes, and lower extremity swelling. ° ° °Relevant past medical, surgical, family and social history reviewed and updated as indicated. Interim medical history since our last visit reviewed. °Allergies and medications reviewed and updated. ° °Review of Systems  °Eyes:  Negative for visual disturbance.  °Respiratory:  Negative for shortness of breath.   °Cardiovascular:  Negative for chest pain and leg swelling.  °Neurological:  Negative for light-headedness and headaches.  ° °Per HPI unless specifically indicated above ° °   °Objective:  °  °BP 129/80    Pulse 78    Temp 98.3 °F (36.8 °C) (Oral)    Ht 5' 8.31" (1.735 m)    Wt 219 lb 12.8 oz (99.7 kg)    SpO2 98%    BMI 33.12 kg/m²   °Wt Readings from Last 3 Encounters:  °01/21/22 219 lb 12.8 oz (99.7 kg)  °01/11/22 212 lb 12.8 oz (96.5 kg)  °06/21/21 204 lb 12.8 oz (92.9 kg)  °  °Physical Exam °Vitals and nursing note reviewed.  °Constitutional:   °   General: He is not in acute distress. °   Appearance: Normal appearance. He is not ill-appearing, toxic-appearing or diaphoretic.  °HENT:  °   Head: Normocephalic.  °   Right Ear: External ear normal.  °   Left Ear: External ear normal.   °   Nose: Nose normal. No congestion or rhinorrhea.  °   Mouth/Throat:  °   Mouth: Mucous membranes are moist.  °Eyes:  °   General:     °   Right eye: No discharge.     °   Left eye: No discharge.  °   Extraocular Movements: Extraocular movements intact.  °   Conjunctiva/sclera: Conjunctivae normal.  °   Pupils: Pupils are equal, round, and reactive to light.  °Cardiovascular:  °   Rate and Rhythm: Normal rate and regular rhythm.  °   Heart sounds: No murmur heard. °Pulmonary:  °   Effort: Pulmonary effort is normal. No respiratory distress.  °   Breath sounds: Normal breath sounds. No wheezing, rhonchi or rales.  °Abdominal:  °   General: Abdomen is flat. Bowel sounds are normal.  °Musculoskeletal:  °   Cervical back: Normal range of motion and neck supple.  °Skin: °   General: Skin is warm and dry.  °   Capillary Refill: Capillary refill takes less than 2 seconds.  °Neurological:  °   General: No focal deficit present.  °     Mental Status: He is alert and oriented to person, place, and time.  Psychiatric:        Mood and Affect: Mood normal.        Behavior: Behavior normal.        Thought Content: Thought content normal.        Judgment: Judgment normal.    Results for orders placed or performed in visit on 01/11/22  Comp Met (CMET)  Result Value Ref Range   Glucose 93 70 - 99 mg/dL   BUN 12 6 - 24 mg/dL   Creatinine, Ser 1.23 0.76 - 1.27 mg/dL   eGFR 73 >59 mL/min/1.73   BUN/Creatinine Ratio 10 9 - 20   Sodium 143 134 - 144 mmol/L   Potassium 5.3 (H) 3.5 - 5.2 mmol/L   Chloride 105 96 - 106 mmol/L   CO2 25 20 - 29 mmol/L   Calcium 9.5 8.7 - 10.2 mg/dL   Total Protein 6.8 6.0 - 8.5 g/dL   Albumin 4.2 4.0 - 5.0 g/dL   Globulin, Total 2.6 1.5 - 4.5 g/dL   Albumin/Globulin Ratio 1.6 1.2 - 2.2   Bilirubin Total 0.2 0.0 - 1.2 mg/dL   Alkaline Phosphatase 81 44 - 121 IU/L   AST 20 0 - 40 IU/L   ALT 26 0 - 44 IU/L  Lipid Profile  Result Value Ref Range   Cholesterol, Total 227 (H) 100 -  199 mg/dL   Triglycerides 215 (H) 0 - 149 mg/dL   HDL 43 >39 mg/dL   VLDL Cholesterol Cal 39 5 - 40 mg/dL   LDL Chol Calc (NIH) 145 (H) 0 - 99 mg/dL   Chol/HDL Ratio 5.3 (H) 0.0 - 5.0 ratio  HgB A1c  Result Value Ref Range   Hgb A1c MFr Bld 5.9 (H) 4.8 - 5.6 %   Est. average glucose Bld gHb Est-mCnc 123 mg/dL      Assessment & Plan:   Problem List Items Addressed This Visit       Respiratory   Allergic rhinitis - Primary    Chronic.  Controlled.  Continue with current medication regimen on Singulair. Return to clinic in 6 months for reevaluation.  Call sooner if concerns arise.        Other Visit Diagnoses     Screening for colon cancer       Referral placed for colonoscopy.   Relevant Orders   Ambulatory referral to Gastroenterology        Follow up plan: Return if symptoms worsen or fail to improve.

## 2022-01-21 NOTE — Assessment & Plan Note (Signed)
Chronic.  Controlled.  Continue with current medication regimen on Singulair. Return to clinic in 6 months for reevaluation.  Call sooner if concerns arise.

## 2022-01-22 ENCOUNTER — Other Ambulatory Visit: Payer: Self-pay

## 2022-01-22 DIAGNOSIS — Z1211 Encounter for screening for malignant neoplasm of colon: Secondary | ICD-10-CM

## 2022-01-22 MED ORDER — NA SULFATE-K SULFATE-MG SULF 17.5-3.13-1.6 GM/177ML PO SOLN: 1.0000 | Freq: Once | ORAL | 0 refills | Status: AC

## 2022-01-22 NOTE — Progress Notes (Signed)
Gastroenterology Pre-Procedure Review  Request Date: 02/07/2022  Requesting Physician: Dr. Vicente Males  PATIENT REVIEW QUESTIONS: The patient responded to the following health history questions as indicated:    1. Are you having any GI issues? no 2. Do you have a personal history of Polyps? no 3. Do you have a family history of Colon Cancer or Polyps? no 4. Diabetes Mellitus? no 5. Joint replacements in the past 12 months?no 6. Major health problems in the past 3 months?no 7. Any artificial heart valves, MVP, or defibrillator?no    MEDICATIONS & ALLERGIES:    Patient reports the following regarding taking any anticoagulation/antiplatelet therapy:   Plavix, Coumadin, Eliquis, Xarelto, Lovenox, Pradaxa, Brilinta, or Effient? no Aspirin? no  Patient confirms/reports the following medications:  Current Outpatient Medications  Medication Sig Dispense Refill   levocetirizine (XYZAL) 5 MG tablet TAKE 1 TABLET BY MOUTH EVERY DAY IN THE EVENING 90 tablet 0   montelukast (SINGULAIR) 10 MG tablet TAKE 1 TABLET BY MOUTH EVERY DAY 90 tablet 0   No current facility-administered medications for this visit.    Patient confirms/reports the following allergies:  No Known Allergies  No orders of the defined types were placed in this encounter.   AUTHORIZATION INFORMATION Primary Insurance: 1D#: Group #:  Secondary Insurance: 1D#: Group #:  SCHEDULE INFORMATION: Date: 02/07/2022 Time: Location:[facility]

## 2022-02-07 ENCOUNTER — Encounter: Payer: Self-pay | Admitting: Gastroenterology

## 2022-02-07 ENCOUNTER — Ambulatory Visit: Payer: 59 | Admitting: Anesthesiology

## 2022-02-07 ENCOUNTER — Ambulatory Visit
Admission: RE | Admit: 2022-02-07 | Discharge: 2022-02-07 | Disposition: A | Discharge status: Home / Self Care | Payer: 59 | Attending: Gastroenterology | Admitting: Gastroenterology

## 2022-02-07 ENCOUNTER — Other Ambulatory Visit: Payer: Self-pay

## 2022-02-07 ENCOUNTER — Encounter
Admission: RE | Disposition: A | Discharge status: Home / Self Care | Payer: Self-pay | Source: Home / Self Care | Attending: Gastroenterology

## 2022-02-07 DIAGNOSIS — D122 Benign neoplasm of ascending colon: Secondary | ICD-10-CM | POA: Insufficient documentation

## 2022-02-07 DIAGNOSIS — K635 Polyp of colon: Secondary | ICD-10-CM

## 2022-02-07 DIAGNOSIS — E785 Hyperlipidemia, unspecified: Secondary | ICD-10-CM | POA: Diagnosis not present

## 2022-02-07 DIAGNOSIS — Z1211 Encounter for screening for malignant neoplasm of colon: Secondary | ICD-10-CM

## 2022-02-07 HISTORY — PX: COLONOSCOPY WITH PROPOFOL: SHX5780

## 2022-02-07 HISTORY — DX: Allergy, unspecified, initial encounter: T78.40XA

## 2022-02-07 SURGERY — COLONOSCOPY WITH PROPOFOL
Anesthesia: General

## 2022-02-07 MED ORDER — MIDAZOLAM HCL 2 MG/2ML IJ SOLN
INTRAMUSCULAR | Status: DC | PRN
  Administered 2022-02-07: 2 mg via INTRAVENOUS

## 2022-02-07 MED ORDER — PROPOFOL 500 MG/50ML IV EMUL
INTRAVENOUS | Status: DC | PRN
Start: 2022-02-07 — End: 2022-02-07
  Administered 2022-02-07: 165 ug/kg/min via INTRAVENOUS

## 2022-02-07 MED ORDER — LIDOCAINE HCL (CARDIAC) PF 100 MG/5ML IV SOSY
PREFILLED_SYRINGE | INTRAVENOUS | Status: DC | PRN
  Administered 2022-02-07: 100 mg via INTRAVENOUS

## 2022-02-07 MED ORDER — MIDAZOLAM HCL 2 MG/2ML IJ SOLN
INTRAMUSCULAR | Status: AC
  Filled 2022-02-07: qty 2

## 2022-02-07 MED ORDER — GLYCOPYRROLATE 0.2 MG/ML IJ SOLN
INTRAMUSCULAR | Status: DC | PRN
  Administered 2022-02-07: .2 mg via INTRAVENOUS

## 2022-02-07 MED ORDER — PROPOFOL 10 MG/ML IV BOLUS
INTRAVENOUS | Status: DC | PRN
  Administered 2022-02-07: 30 mg via INTRAVENOUS
  Administered 2022-02-07: 70 mg via INTRAVENOUS
  Administered 2022-02-07: 30 mg via INTRAVENOUS

## 2022-02-07 MED ORDER — DEXMEDETOMIDINE HCL IN NACL 200 MCG/50ML IV SOLN
INTRAVENOUS | Status: DC | PRN
  Administered 2022-02-07: 8 ug via INTRAVENOUS

## 2022-02-07 MED ORDER — SODIUM CHLORIDE 0.9 % IV SOLN: INTRAVENOUS | Status: DC

## 2022-02-07 NOTE — H&P (Signed)
° ° ° °  Jonathon Bellows, MD [address], [place], [place], [place], [address] [address], [place], [place], [place], [address] Phone: [phone]  Fax: [phone]  Primary Care Physician:  Jon Billings, NP   Pre-Procedure History & Physical: HPI:  Connor Rodriguez is a 46 y.o. male is here for an colonoscopy.   Past Medical History:  Diagnosis Date   Allergic rhinitis 11/06/2020   Allergies    Hyperlipidemia     History reviewed. No pertinent surgical history.  Prior to Admission medications   Medication Sig Start Date End Date Taking? Authorizing Provider  levocetirizine (XYZAL) 5 MG tablet TAKE 1 TABLET BY MOUTH EVERY DAY IN THE EVENING 08/26/21  Yes Jon Billings, NP  montelukast (SINGULAIR) 10 MG tablet TAKE 1 TABLET BY MOUTH EVERY DAY 08/26/21  Yes Jon Billings, NP    Allergies as of 01/22/2022   (No Known Allergies)    Family History  Problem Relation Age of Onset   Cancer Mother    Diabetes Father     Social History   Socioeconomic History   Marital status: Single    Spouse name: Not on file   Number of children: Not on file   Years of education: Not on file   Highest education level: Not on file  Occupational History   Not on file  Tobacco Use   Smoking status: Never   Smokeless tobacco: Never  Vaping Use   Vaping Use: Never used  Substance and Sexual Activity   Alcohol use: Never   Drug use: Never   Sexual activity: Yes  Other Topics Concern   Not on file  Social History Narrative   Not on file   Social Determinants of Health   Financial Resource Strain: Not on file  Food Insecurity: Not on file  Transportation Needs: Not on file  Physical Activity: Not on file  Stress: Not on file  Social Connections: Not on file  Intimate Partner Violence: Not on file    Review of Systems: See HPI, otherwise negative ROS  Physical Exam: BP (!) 135/103    Pulse (!) 58    Temp (!) 96.8 F (36 C) (Temporal)    Resp 18    Ht 5\' 9"  (1.753  m)    Wt 97.5 kg    SpO2 100%    BMI 31.75 kg/m  General:   Alert,  pleasant and cooperative in NAD Head:  Normocephalic and atraumatic. Neck:  Supple; no masses or thyromegaly. Lungs:  Clear throughout to auscultation, normal respiratory effort.    Heart:  +S1, +S2, Regular rate and rhythm, No edema. Abdomen:  Soft, nontender and nondistended. Normal bowel sounds, without guarding, and without rebound.   Neurologic:  Alert and  oriented x4;  grossly normal neurologically.  Impression/Plan: Connor Rodriguez is here for an colonoscopy to be performed for Screening colonoscopy average risk   Risks, benefits, limitations, and alternatives regarding  colonoscopy have been reviewed with the patient.  Questions have been answered.  All parties agreeable.   Jonathon Bellows, MD  02/07/2022, 8:40 AM

## 2022-02-07 NOTE — Transfer of Care (Signed)
Immediate Anesthesia Transfer of Care Note  Patient: Connor Rodriguez  Procedure(s) Performed: COLONOSCOPY WITH PROPOFOL  Patient Location: Endoscopy Unit  Anesthesia Type:General  Level of Consciousness: awake, drowsy and patient cooperative  Airway & Oxygen Therapy: Patient Spontanous Breathing and Patient connected to face mask oxygen  Post-op Assessment: Report given to RN and Post -op Vital signs reviewed and stable , will cont to monitor pt bp and treat as MDA advise recovery.  Post vital signs: Reviewed and stable  Last Vitals:  Vitals Value Taken Time  BP 162/138 02/07/22 0911  Temp    Pulse 87 02/07/22 0914  Resp 23 02/07/22 0914  SpO2 98 % 02/07/22 0914  Vitals shown include unvalidated device data.  Last Pain:  Vitals:   02/07/22 0911  TempSrc:   PainSc: 0-No pain         Complications: No notable events documented.

## 2022-02-07 NOTE — Anesthesia Preprocedure Evaluation (Signed)
Anesthesia Evaluation  Patient identified by MRN, date of birth, ID band Patient awake    Reviewed: Allergy & Precautions, NPO status , Patient's Chart, lab work & pertinent test results  History of Anesthesia Complications Negative for: history of anesthetic complications  Airway Mallampati: III  TM Distance: >3 FB Neck ROM: full    Dental  (+) Chipped   Pulmonary neg pulmonary ROS, neg shortness of breath,    Pulmonary exam normal        Cardiovascular Exercise Tolerance: Good (-) angina(-) Past MI negative cardio ROS Normal cardiovascular exam     Neuro/Psych negative neurological ROS  negative psych ROS   GI/Hepatic negative GI ROS, Neg liver ROS, neg GERD  ,  Endo/Other  negative endocrine ROS  Renal/GU negative Renal ROS  negative genitourinary   Musculoskeletal   Abdominal   Peds  Hematology negative hematology ROS (+)   Anesthesia Other Findings Past Medical History: 11/06/2020: Allergic rhinitis No date: Allergies No date: Hyperlipidemia  History reviewed. No pertinent surgical history.  BMI    Body Mass Index: 31.75 kg/m      Reproductive/Obstetrics negative OB ROS                             Anesthesia Physical Anesthesia Plan  ASA: 2  Anesthesia Plan: General   Post-op Pain Management:    Induction: Intravenous  PONV Risk Score and Plan: Propofol infusion and TIVA  Airway Management Planned: Natural Airway and Nasal Cannula  Additional Equipment:   Intra-op Plan:   Post-operative Plan:   Informed Consent: I have reviewed the patients History and Physical, chart, labs and discussed the procedure including the risks, benefits and alternatives for the proposed anesthesia with the patient or authorized representative who has indicated his/her understanding and acceptance.     Dental Advisory Given  Plan Discussed with: Anesthesiologist, CRNA and  Surgeon  Anesthesia Plan Comments: (Patient consented for risks of anesthesia including but not limited to:  - adverse reactions to medications - risk of airway placement if required - damage to eyes, teeth, lips or other oral mucosa - nerve damage due to positioning  - sore throat or hoarseness - Damage to heart, brain, nerves, lungs, other parts of body or loss of life  Patient voiced understanding.)        Anesthesia Quick Evaluation

## 2022-02-07 NOTE — Anesthesia Procedure Notes (Signed)
Procedure Name: General with mask airway Date/Time: 02/07/2022 9:00 AM Performed by: Kelton Pillar, CRNA Pre-anesthesia Checklist: Patient identified, Emergency Drugs available, Suction available and Patient being monitored Patient Re-evaluated:Patient Re-evaluated prior to induction Oxygen Delivery Method: Simple face mask Induction Type: IV induction Placement Confirmation: positive ETCO2 and CO2 detector Dental Injury: Teeth and Oropharynx as per pre-operative assessment

## 2022-02-07 NOTE — Anesthesia Postprocedure Evaluation (Signed)
Anesthesia Post Note  Patient: Connor Rodriguez  Procedure(s) Performed: COLONOSCOPY WITH PROPOFOL  Patient location during evaluation: Endoscopy Anesthesia Type: General Level of consciousness: awake and alert Pain management: pain level controlled Vital Signs Assessment: post-procedure vital signs reviewed and stable Respiratory status: spontaneous breathing, nonlabored ventilation, respiratory function stable and patient connected to nasal cannula oxygen Cardiovascular status: blood pressure returned to baseline and stable Postop Assessment: no apparent nausea or vomiting Anesthetic complications: no   No notable events documented.   Last Vitals:  Vitals:   02/07/22 0951 02/07/22 1001  BP:  (!) 131/91  Pulse: 70 63  Resp: 14 15  Temp:    SpO2: 100% 100%    Last Pain:  Vitals:   02/07/22 1001  TempSrc:   PainSc: 0-No pain                 Precious Haws Dede Dobesh

## 2022-02-07 NOTE — Op Note (Signed)
[facility] Patient Name: Connor Rodriguez Procedure Date: 02/07/2022 8:47 AM MRN: [id number] Account #: [id number] Date of Birth: 06-16-1976 Admit Type: Outpatient Age: 46 Room: [facility] ENDO ROOM 3 Gender: Male Note Status: Finalized Instrument Name: Jasper Riling [id number] Procedure:             Colonoscopy Indications:           Screening for colorectal malignant neoplasm Providers:             Jonathon Bellows MD, MD Referring MD:          Jon Billings (Referring MD) Medicines:             Monitored Anesthesia Care Complications:         No immediate complications. Procedure:             Pre-Anesthesia Assessment:                        - Prior to the procedure, a History and Physical was                         performed, and patient medications, allergies and                         sensitivities were reviewed. The patient's tolerance                         of previous anesthesia was reviewed.                        - The risks and benefits of the procedure and the                         sedation options and risks were discussed with the                         patient. All questions were answered and informed                         consent was obtained.                        - ASA Grade Assessment: II - A patient with mild                         systemic disease.                        After obtaining informed consent, the colonoscope was                         passed under direct vision. Throughout the procedure,                         the patient's blood pressure, pulse, and oxygen                         saturations were monitored continuously. The                         Colonoscope was introduced through  the anus and                         advanced to the the cecum, identified by the                         appendiceal orifice. The colonoscopy was performed                         with ease. The patient tolerated the procedure well.                          The quality of the bowel preparation was excellent. Findings:      The perianal and digital rectal examinations were normal.      A 7 mm polyp was found in the ascending colon. The polyp was sessile.       The polyp was removed with a cold snare. Resection and retrieval were       complete.      The exam was otherwise without abnormality on direct and retroflexion       views. Impression:            - One 7 mm polyp in the ascending colon, removed with                         a cold snare. Resected and retrieved.                        - The examination was otherwise normal on direct and                         retroflexion views. Recommendation:        - Discharge patient to home (with escort).                        - Resume previous diet.                        - Continue present medications.                        - Await pathology results.                        - Repeat colonoscopy for surveillance based on                         pathology results. Procedure Code(s):     --- Professional ---                        [id number], Colonoscopy, flexible; with removal of                         tumor(s), polyp(s), or other lesion(s) by snare                         technique Diagnosis Code(s):     --- Professional ---                        Z12.11,  Encounter for screening for malignant neoplasm                         of colon                        K63.5, Polyp of colon CPT copyright 2019 American Medical Association. All rights reserved. The codes documented in this report are preliminary and upon coder review may  be revised to meet current compliance requirements. Jonathon Bellows, MD Jonathon Bellows MD, MD 02/07/2022 9:09:57 AM This report has been signed electronically. Number of Addenda: 0 Note Initiated On: 02/07/2022 8:47 AM Scope Withdrawal Time: 0 hours 10 minutes 21 seconds  Total Procedure Duration: 0 hours 13 minutes 53 seconds  Estimated Blood Loss:  Estimated  blood loss: none.      [facility]

## 2022-02-08 ENCOUNTER — Encounter: Payer: Self-pay | Admitting: Gastroenterology

## 2022-02-08 LAB — SURGICAL PATHOLOGY

## 2022-02-10 ENCOUNTER — Encounter: Payer: Self-pay | Admitting: Gastroenterology

## 2022-05-08 ENCOUNTER — Telehealth: Payer: Self-pay | Admitting: Nurse Practitioner

## 2022-05-08 ENCOUNTER — Encounter: Payer: Self-pay | Admitting: Nurse Practitioner

## 2022-05-08 NOTE — Telephone Encounter (Signed)
Pt called to report that he needs a document faxed to his job proving that he had a CPE last year. They want to know when and by which provider. ? ?[phone]  ?AttnWebb Silversmith from [place]  ?Location: [place]  ?

## 2022-05-08 NOTE — Telephone Encounter (Signed)
Letter has been faxed to employer ?

## 2022-05-23 ENCOUNTER — Ambulatory Visit: Payer: 59 | Admitting: Nurse Practitioner

## 2022-05-30 ENCOUNTER — Ambulatory Visit: Payer: Self-pay

## 2022-05-30 DIAGNOSIS — Z0000 Encounter for general adult medical examination without abnormal findings: Secondary | ICD-10-CM

## 2022-05-30 LAB — POCT URINALYSIS DIPSTICK
Bilirubin, UA: NEGATIVE
Blood, UA: NEGATIVE
Glucose, UA: NEGATIVE
Ketones, UA: NEGATIVE
Leukocytes, UA: NEGATIVE
Nitrite, UA: NEGATIVE
Protein, UA: NEGATIVE
Spec Grav, UA: 1.025 (ref 1.010–1.025)
Urobilinogen, UA: 0.2 E.U./dL
pH, UA: 5.5 (ref 5.0–8.0)

## 2022-05-30 NOTE — Progress Notes (Signed)
Pt presents today for physical labs, will return to clinic for scheduled physical.  

## 2022-05-31 LAB — CMP12+LP+TP+TSH+6AC+PSA+CBC…
ALT: 23 IU/L (ref 0–44)
AST: 21 IU/L (ref 0–40)
Albumin/Globulin Ratio: 1.7 (ref 1.2–2.2)
Albumin: 4.3 g/dL (ref 4.0–5.0)
Alkaline Phosphatase: 82 IU/L (ref 44–121)
BUN/Creatinine Ratio: 11 (ref 9–20)
BUN: 15 mg/dL (ref 6–24)
Basophils Absolute: 0 10*3/uL (ref 0.0–0.2)
Basos: 0 %
Bilirubin Total: 0.3 mg/dL (ref 0.0–1.2)
Calcium: 9.3 mg/dL (ref 8.7–10.2)
Chloride: 104 mmol/L (ref 96–106)
Chol/HDL Ratio: 4.4 ratio (ref 0.0–5.0)
Cholesterol, Total: 229 mg/dL — ABNORMAL HIGH (ref 100–199)
Creatinine, Ser: 1.31 mg/dL — ABNORMAL HIGH (ref 0.76–1.27)
EOS (ABSOLUTE): 0.2 10*3/uL (ref 0.0–0.4)
Eos: 3 %
Estimated CHD Risk: 0.9 times avg. (ref 0.0–1.0)
Free Thyroxine Index: 1.5 (ref 1.2–4.9)
GGT: 23 IU/L (ref 0–65)
Globulin, Total: 2.6 g/dL (ref 1.5–4.5)
Glucose: 111 mg/dL — ABNORMAL HIGH (ref 70–99)
HDL: 52 mg/dL (ref >39)
Hematocrit: 44.6 % (ref 37.5–51.0)
Hemoglobin: 15.5 g/dL (ref 13.0–17.7)
Immature Grans (Abs): 0 10*3/uL (ref 0.0–0.1)
Immature Granulocytes: 0 %
Iron: 73 ug/dL (ref 38–169)
LDH: 244 IU/L — ABNORMAL HIGH (ref 121–224)
LDL Chol Calc (NIH): 165 mg/dL — ABNORMAL HIGH (ref 0–99)
Lymphocytes Absolute: 1.4 10*3/uL (ref 0.7–3.1)
Lymphs: 21 %
MCH: 30.6 pg (ref 26.6–33.0)
MCHC: 34.8 g/dL (ref 31.5–35.7)
MCV: 88 fL (ref 79–97)
Monocytes Absolute: 0.7 10*3/uL (ref 0.1–0.9)
Monocytes: 9 %
Neutrophils Absolute: 4.6 10*3/uL (ref 1.4–7.0)
Neutrophils: 67 %
Phosphorus: 3.6 mg/dL (ref 2.8–4.1)
Platelets: 198 10*3/uL (ref 150–450)
Potassium: 4.1 mmol/L (ref 3.5–5.2)
Prostate Specific Ag, Serum: 1 ng/mL (ref 0.0–4.0)
RBC: 5.06 x10E6/uL (ref 4.14–5.80)
RDW: 13.3 % (ref 11.6–15.4)
Sodium: 141 mmol/L (ref 134–144)
T3 Uptake Ratio: 25 % (ref 24–39)
T4, Total: 6.1 ug/dL (ref 4.5–12.0)
TSH: 2.73 u[IU]/mL (ref 0.450–4.500)
Total Protein: 6.9 g/dL (ref 6.0–8.5)
Triglycerides: 72 mg/dL (ref 0–149)
Uric Acid: 6.5 mg/dL (ref 3.8–8.4)
VLDL Cholesterol Cal: 12 mg/dL (ref 5–40)
WBC: 6.9 10*3/uL (ref 3.4–10.8)
eGFR: 68 mL/min/{1.73_m2} (ref >59)

## 2022-05-31 LAB — HGB A1C W/O EAG: Hgb A1c MFr Bld: 5.8 % — ABNORMAL HIGH (ref 4.8–5.6)

## 2022-06-06 ENCOUNTER — Ambulatory Visit: Payer: Self-pay | Admitting: Physician Assistant

## 2022-06-06 ENCOUNTER — Encounter: Payer: Self-pay | Admitting: Physician Assistant

## 2022-06-06 VITALS — BP 131/93 | HR 80 | Temp 97.6°F | Resp 14 | Ht 69.0 in | Wt 211.0 lb

## 2022-06-06 DIAGNOSIS — Z0000 Encounter for general adult medical examination without abnormal findings: Secondary | ICD-10-CM

## 2022-06-06 NOTE — Progress Notes (Signed)
[facility] Provider Note  ____________________________________________   None    (approximate)  I have reviewed the triage vital signs and the nursing notes.   HISTORY  Chief Complaint Annual Exam   HPI Connor Rodriguez is a 46 y.o. male patient presents for annual physical exam.  Patient with no concerning complaints.         Past Medical History:  Diagnosis Date   Allergic rhinitis 11/06/2020   Allergies    Hyperlipidemia     Patient Active Problem List   Diagnosis Date Noted   Impaired fasting glucose (pre-diabetes)  11/06/2020   Allergic rhinitis 11/06/2020   Hyperlipidemia     Past Surgical History:  Procedure Laterality Date   COLONOSCOPY WITH PROPOFOL N/A 02/07/2022   Procedure: COLONOSCOPY WITH PROPOFOL;  Surgeon: Jonathon Bellows, MD;  Location: [facility] ENDOSCOPY;  Service: Gastroenterology;  Laterality: N/A;    Prior to Admission medications   Medication Sig Start Date End Date Taking? Authorizing Provider  levocetirizine (XYZAL) 5 MG tablet TAKE 1 TABLET BY MOUTH EVERY DAY IN THE EVENING 08/26/21  Yes Jon Billings, NP  montelukast (SINGULAIR) 10 MG tablet TAKE 1 TABLET BY MOUTH EVERY DAY 08/26/21  Yes Jon Billings, NP  Na Sulfate-K Sulfate-Mg Sulf 17.5-3.13-1.6 GM/177ML SOLN See admin instructions. 01/22/22  Yes [provider]    Allergies Patient has no known allergies.  Family History  Problem Relation Age of Onset   Cancer Mother    Diabetes Father     Social History Social History   Tobacco Use   Smoking status: Never   Smokeless tobacco: Never  Vaping Use   Vaping Use: Never used  Substance Use Topics   Alcohol use: Never   Drug use: Never    Review of Systems Constitutional: No fever/chills Eyes: No visual changes. ENT: No sore throat. Cardiovascular: Denies chest pain. Respiratory: Denies shortness of breath. Gastrointestinal: No abdominal pain.  No nausea, no vomiting.   No diarrhea.  No constipation. Genitourinary: Negative for dysuria. Musculoskeletal: Negative for back pain. Skin: Negative for rash. Neurological: Negative for headaches, focal weakness or numbness. Endocrine: Hyperlipidemia   ____________________________________________   PHYSICAL EXAM:  VITAL SIGNS: BP is 131/93, pulse 80, respiration 14, temperature 97.6, and patient 90% O2 sat on room air.  Patient weighs 211 pounds and BMI is 31.16. Constitutional: Alert and oriented. Well appearing and in no acute distress. Eyes: Conjunctivae are normal. PERRL. EOMI. Head: Atraumatic. Nose: No congestion/rhinnorhea. Mouth/Throat: Mucous membranes are moist.  Oropharynx non-erythematous. Neck: No stridor.  No cervical spine tenderness to palpation. Hematological/Lymphatic/Immunilogical: No cervical lymphadenopathy. Cardiovascular: Normal rate, regular rhythm. Grossly normal heart sounds.  Good peripheral circulation. Respiratory: Normal respiratory effort.  No retractions. Lungs CTAB. Gastrointestinal: Soft and nontender. No distention. No abdominal bruits. No CVA tenderness. Genitourinary: Deferred Musculoskeletal: No lower extremity tenderness nor edema.  No joint effusions. Neurologic:  Normal speech and language. No gross focal neurologic deficits are appreciated. No gait instability. Skin:  Skin is warm, dry and intact. No rash noted. Psychiatric: Mood and affect are normal. Speech and behavior are normal.  ____________________________________________   LABS__       Component Ref Range & Units 7 d ago 11 mo ago 1 yr ago  Color, UA  Dark Yellow  Yellow R  Yellow   Clarity, UA  Clear   Clear   Glucose, UA Negative Negative   Negative   Bilirubin, UA  Negative  Negative R  Negative  Ketones, UA  Negative  Negative R  Negative   Spec Grav, UA 1.010 - 1.025 1.025  1.025 R  1.025   Blood, UA  Negative  Negative R  Negative   pH, UA 5.0 - 8.0 5.5  5.5 R  6.0   Protein, UA Negative  Negative  Negative R  Negative   Urobilinogen, UA 0.2 or 1.0 E.U./dL 0.2   0.2   Nitrite, UA  Negative  Negative R  Negative   Leukocytes, UA Negative Negative  Negative  Negative   Appearance       Odor       Resulting Agency   [facility]                                Component Ref Range & Units 7 d ago (05/30/22) 4 mo ago (01/11/22) 4 mo ago (01/11/22) 11 mo ago (07/10/21) 11 mo ago (06/21/21) 11 mo ago (06/21/21) 11 mo ago (06/21/21) 11 mo ago (06/21/21) 11 mo ago (06/21/21)  Glucose 70 - 99 mg/dL 111 High    93  98 R  66 R       Uric Acid 3.8 - 8.4 mg/dL 6.5           Comment:            Therapeutic target for gout patients: <6.0  BUN 6 - 24 mg/dL 15   12  12  14        Creatinine, Ser 0.76 - 1.27 mg/dL 1.31 High    1.23  1.24  1.29 High        eGFR >59 mL/min/1.73 68   73  73  69       BUN/Creatinine Ratio 9 - 20 11   10  10  11        Sodium 134 - 144 mmol/L 141   143  139  138       Potassium 3.5 - 5.2 mmol/L 4.1   5.3 High   4.0  4.1       Chloride 96 - 106 mmol/L 104   105  103  101       Calcium 8.7 - 10.2 mg/dL 9.3   9.5  9.0  9.2       Phosphorus 2.8 - 4.1 mg/dL 3.6           Total Protein 6.0 - 8.5 g/dL 6.9   6.8  6.5  6.3       Albumin 4.0 - 5.0 g/dL 4.3   4.2  4.1  4.0       Globulin, Total 1.5 - 4.5 g/dL 2.6   2.6  2.4  2.3       Albumin/Globulin Ratio 1.2 - 2.2 1.7   1.6  1.7  1.7       Bilirubin Total 0.0 - 1.2 mg/dL 0.3   0.2  0.2  <0.2       Alkaline Phosphatase 44 - 121 IU/L 82   81  84  91       LDH 121 - 224 IU/L 244 High            AST 0 - 40 IU/L 21   20  15  22        ALT 0 - 44 IU/L 23   26  14  25        GGT 0 - 65 IU/L 23  Iron 38 - 169 ug/dL 73           Cholesterol, Total 100 - 199 mg/dL 229 High   227 High       201 High      Triglycerides 0 - 149 mg/dL 72  215 High       114     HDL >39 mg/dL 52  43      49     VLDL Cholesterol Cal 5 - 40 mg/dL 12  39      20     LDL Chol Calc (NIH) 0 - 99 mg/dL 165 High   145 High       132 High       Chol/HDL Ratio 0.0 - 5.0 ratio 4.4  5.3 High  CM      4.1 CM       Estimated CHD Risk 0.0 - 1.0 times avg. 0.9             TSH 0.450 - 4.500 uIU/mL 2.730         0.676   T4, Total 4.5 - 12.0 ug/dL 6.1           T3 Uptake Ratio 24 - 39 % 25           Free Thyroxine Index 1.2 - 4.9 1.5           Prostate Specific Ag, Serum 0.0 - 4.0 ng/mL 1.0        1.3 CM       WBC 3.4 - 10.8 x10E3/uL 6.9      7.7      RBC 4.14 - 5.80 x10E6/uL 5.06      4.77      Hemoglobin 13.0 - 17.7 g/dL 15.5      14.3      Hematocrit 37.5 - 51.0 % 44.6      43.4      MCV 79 - 97 fL 88      91      MCH 26.6 - 33.0 pg 30.6      30.0      MCHC 31.5 - 35.7 g/dL 34.8      32.9      RDW 11.6 - 15.4 % 13.3      13.7      Platelets 150 - 450 x10E3/uL 198      239      Neutrophils Not Estab. % 67      68      Lymphs Not Estab. % 21      18      Monocytes Not Estab. % 9      9      Eos Not Estab. % 3      3      Basos Not Estab. % 0      1      Neutrophils Absolute 1.4 - 7.0 x10E3/uL 4.6      5.3      Lymphocytes Absolute 0.7 - 3.1 x10E3/uL 1.4      1.4      Monocytes Absolute 0.1 - 0.9 x10E3/uL 0.7      0.7      EOS (ABSOLUTE) 0.0 - 0.4 x10E3/uL 0.2      0.2      Basophils Absolute 0.0 - 0.2 x10E3/uL 0.0      0.1      Immature Granulocytes Not Estab. % 0      1  Immature Grans (Abs) 0.0 - 0.1 x10E3/uL 0.0      0.1      Resulting Agency  [facility]           0 Result Notes       Component Ref Range & Units 7 d ago 4 mo ago 11 mo ago           _________________________________________  EKG  Sinus  Rhythm at 60 bpm Voltage criteria for LVH  (R(V5) exceeds 2.60 mV)  -Voltage criteria w/o ST/T abnormality may be normal.   BORDERLINE ____________________________________________    ____________________________________________   INITIAL IMPRESSION / ASSESSMENT AND PLAN  As part of my medical decision making, I reviewed the following data  within the [place]      Discussed lab and EKG results with patient.  Advised diet and exercise to reduce cholesterol at this time.  Follow-up in 6 months.        ____________________________________________   FINAL CLINICAL IMPRESSION  Well exam   ED Discharge Orders     None        Note:  This document was prepared using Dragon voice recognition software and may include unintentional dictation errors.

## 2022-06-06 NOTE — Progress Notes (Signed)
Pt presents today to complete physical. Pt has some questions about his allergy medication.

## 2022-07-08 ENCOUNTER — Ambulatory Visit: Payer: 59 | Admitting: Nurse Practitioner

## 2022-07-11 ENCOUNTER — Encounter: Payer: 59 | Admitting: Nurse Practitioner

## 2023-01-14 ENCOUNTER — Encounter: Payer: Self-pay | Admitting: Physician Assistant

## 2023-01-14 ENCOUNTER — Ambulatory Visit: Payer: Self-pay | Admitting: Physician Assistant

## 2023-01-14 VITALS — BP 138/104 | HR 71 | Temp 97.5°F | Resp 16

## 2023-01-14 DIAGNOSIS — Z1152 Encounter for screening for COVID-19: Secondary | ICD-10-CM

## 2023-01-14 DIAGNOSIS — J0100 Acute maxillary sinusitis, unspecified: Secondary | ICD-10-CM

## 2023-01-14 LAB — POCT INFLUENZA A/B
Influenza A, POC: NEGATIVE
Influenza B, POC: NEGATIVE

## 2023-01-14 LAB — POC COVID19 BINAXNOW: SARS Coronavirus 2 Ag: NEGATIVE

## 2023-01-14 MED ORDER — AMOXICILLIN 875 MG PO TABS: 875.0000 mg | ORAL_TABLET | Freq: Two times a day (BID) | ORAL | 0 refills | Status: AC

## 2023-01-14 MED ORDER — FEXOFENADINE-PSEUDOEPHED ER 60-120 MG PO TB12: 1.0000 | ORAL_TABLET | Freq: Two times a day (BID) | ORAL | 0 refills | Status: DC

## 2023-01-14 MED ORDER — BENZONATATE 200 MG PO CAPS: 200.0000 mg | ORAL_CAPSULE | Freq: Two times a day (BID) | ORAL | 0 refills | Status: DC | PRN

## 2023-01-14 NOTE — Progress Notes (Signed)
   Subjective: Sinus congestion and cough    Patient ID: Connor Rodriguez, male    DOB: 1976-02-06, 47 y.o.   MRN: [id number]  HPI Patient complains of 1 week of sinus congestion, facial and ear pain, cough, and postnasal drainage.  Denies recent travel or known contact with COVID-19.  Patient tested negative for COVID-19 and influenza today.  States mild relief with over-the-counter medications.   Review of Systems Allergic rhinitis and hyperlipidemia.    Objective:   Physical Exam  BP is 138/104.  Pulse 71, respirations 16, temperature 97.5, patient 90% O2 sat on room air. HEENT is remarkable for bilateral edematous nasal turbinates and maxillary sinus guarding with palpation.  Copious postnasal drainage.  Neck is supple for lymphadenopathy or bruits.  Lungs are clear to auscultation.  Cough with deep inspiration.  Heart is regular rate and rhythm.     Assessment & Plan: Sinusitis   Patient given prescription for amoxicillin, Allegra-D, and Tessalon Perles.  Advise follow-up if no improvement in 5 days.

## 2023-01-14 NOTE — Progress Notes (Signed)
Pt presents today with congestion, facial pressure, and thick mucus since 01/02/23. Fever two days ago.

## 2023-01-14 NOTE — Progress Notes (Signed)
S/Sx started this past Thursday morning: Fever - didn't check with thermometer Productive cough - green phlegm Bodyaches - resolved on Sunday Sinus congestion Headache Facial pain in sinus area No ear discomfort N/V/D - resolved on Monday  OTC meds - Nyquil, Mucinex, Tylenol, Vics Vapor Sinus relief  Worst symptom - pressure in nose, phlegm, sometimes can't breathe out of nose & have to open mouth to suck air in  AMD

## 2023-01-23 ENCOUNTER — Other Ambulatory Visit: Payer: Self-pay

## 2023-01-23 DIAGNOSIS — Z0000 Encounter for general adult medical examination without abnormal findings: Secondary | ICD-10-CM

## 2023-01-23 NOTE — Progress Notes (Signed)
Pt presents today to complete labs for outside provider. Pt is scheduled to complete physical feb 5th with his PC. Send results to pts email, [email or web address].

## 2023-01-24 LAB — CMP12+LP+TP+TSH+6AC+PSA+CBC…
ALT: 36 IU/L (ref 0–44)
AST: 23 IU/L (ref 0–40)
Albumin/Globulin Ratio: 1.5 (ref 1.2–2.2)
Albumin: 4 g/dL — ABNORMAL LOW (ref 4.1–5.1)
Alkaline Phosphatase: 85 IU/L (ref 44–121)
BUN/Creatinine Ratio: 15 (ref 9–20)
BUN: 19 mg/dL (ref 6–24)
Basophils Absolute: 0.1 10*3/uL (ref 0.0–0.2)
Basos: 1 %
Bilirubin Total: 0.3 mg/dL (ref 0.0–1.2)
Calcium: 9.2 mg/dL (ref 8.7–10.2)
Chloride: 101 mmol/L (ref 96–106)
Chol/HDL Ratio: 4.1 ratio (ref 0.0–5.0)
Cholesterol, Total: 195 mg/dL (ref 100–199)
Creatinine, Ser: 1.27 mg/dL (ref 0.76–1.27)
EOS (ABSOLUTE): 0.2 10*3/uL (ref 0.0–0.4)
Eos: 2 %
Estimated CHD Risk: 0.8 times avg. (ref 0.0–1.0)
Free Thyroxine Index: 1.8 (ref 1.2–4.9)
GGT: 29 IU/L (ref 0–65)
Globulin, Total: 2.6 g/dL (ref 1.5–4.5)
Glucose: 96 mg/dL (ref 70–99)
HDL: 48 mg/dL (ref >39)
Hematocrit: 41.8 % (ref 37.5–51.0)
Hemoglobin: 14.3 g/dL (ref 13.0–17.7)
Immature Grans (Abs): 0.1 10*3/uL (ref 0.0–0.1)
Immature Granulocytes: 1 %
Iron: 86 ug/dL (ref 38–169)
LDH: 231 IU/L — ABNORMAL HIGH (ref 121–224)
LDL Chol Calc (NIH): 137 mg/dL — ABNORMAL HIGH (ref 0–99)
Lymphocytes Absolute: 1.8 10*3/uL (ref 0.7–3.1)
Lymphs: 25 %
MCH: 30.4 pg (ref 26.6–33.0)
MCHC: 34.2 g/dL (ref 31.5–35.7)
MCV: 89 fL (ref 79–97)
Monocytes Absolute: 0.7 10*3/uL (ref 0.1–0.9)
Monocytes: 9 %
Neutrophils Absolute: 4.6 10*3/uL (ref 1.4–7.0)
Neutrophils: 62 %
Phosphorus: 4 mg/dL (ref 2.8–4.1)
Platelets: 262 10*3/uL (ref 150–450)
Potassium: 4.3 mmol/L (ref 3.5–5.2)
Prostate Specific Ag, Serum: 2.9 ng/mL (ref 0.0–4.0)
RBC: 4.71 x10E6/uL (ref 4.14–5.80)
RDW: 13.9 % (ref 11.6–15.4)
Sodium: 138 mmol/L (ref 134–144)
T3 Uptake Ratio: 28 % (ref 24–39)
T4, Total: 6.4 ug/dL (ref 4.5–12.0)
TSH: 3.11 u[IU]/mL (ref 0.450–4.500)
Total Protein: 6.6 g/dL (ref 6.0–8.5)
Triglycerides: 56 mg/dL (ref 0–149)
Uric Acid: 6.9 mg/dL (ref 3.8–8.4)
VLDL Cholesterol Cal: 10 mg/dL (ref 5–40)
WBC: 7.4 10*3/uL (ref 3.4–10.8)
eGFR: 70 mL/min/{1.73_m2} (ref >59)

## 2023-01-27 ENCOUNTER — Ambulatory Visit: Payer: 59 | Admitting: Nurse Practitioner

## 2023-01-30 ENCOUNTER — Ambulatory Visit (INDEPENDENT_AMBULATORY_CARE_PROVIDER_SITE_OTHER): Payer: 59 | Admitting: Nurse Practitioner

## 2023-01-30 ENCOUNTER — Encounter: Payer: Self-pay | Admitting: Nurse Practitioner

## 2023-01-30 VITALS — BP 107/70 | HR 67 | Temp 98.3°F | Wt 210.3 lb

## 2023-01-30 DIAGNOSIS — R7301 Impaired fasting glucose: Secondary | ICD-10-CM

## 2023-01-30 DIAGNOSIS — E782 Mixed hyperlipidemia: Secondary | ICD-10-CM

## 2023-01-30 DIAGNOSIS — Z0000 Encounter for general adult medical examination without abnormal findings: Secondary | ICD-10-CM | POA: Diagnosis not present

## 2023-01-30 NOTE — Assessment & Plan Note (Signed)
Labs reviewed from employer.  Glucose was 96.

## 2023-01-30 NOTE — Assessment & Plan Note (Signed)
Reviewed labs from employer.  Discussed low fat diet and exercise.

## 2023-01-30 NOTE — Progress Notes (Signed)
BP 107/70   Pulse 67   Temp 98.3 F (36.8 C) (Oral)   Wt 210 lb 4.8 oz (95.4 kg)   SpO2 98%   BMI 31.06 kg/m    Subjective:    Patient ID: Connor Rodriguez, male    DOB: February 15, 1976, 47 y.o.   MRN: [id number]  HPI: Connor Rodriguez is a 47 y.o. male presenting on 01/30/2023 for comprehensive medical examination. Current medical complaints include:none  He currently lives with: Interim Problems from his last visit: no   Denies HA, CP, SOB, dizziness, palpitations, visual changes, and lower extremity swelling.   Depression Screen done today and results listed below:     01/30/2023    2:04 PM 01/11/2022    1:35 PM 06/21/2021    1:51 PM 05/29/2021    1:04 PM  Depression screen PHQ 2/9  Decreased Interest 0 1 0 0  Down, Depressed, Hopeless 0 1 0 0  PHQ - 2 Score 0 2 0 0  Altered sleeping '1 1 2   '$ Tired, decreased energy '1 1 1   '$ Change in appetite 1 0 0   Feeling bad or failure about yourself  0 0 0   Trouble concentrating 1 1 0   Moving slowly or fidgety/restless 0 0 0   Suicidal thoughts 0 0 0   PHQ-9 Score '4 5 3   '$ Difficult doing work/chores Not difficult at all Not difficult at all      The patient does not have a history of falls. I did complete a risk assessment for falls. A plan of care for falls was documented.   Past Medical History:  Past Medical History:  Diagnosis Date   Allergic rhinitis 11/06/2020   Allergies    Hyperlipidemia     Surgical History:  Past Surgical History:  Procedure Laterality Date   COLONOSCOPY WITH PROPOFOL N/A 02/07/2022   Procedure: COLONOSCOPY WITH PROPOFOL;  Surgeon: Jonathon Bellows, MD;  Location: [facility] ENDOSCOPY;  Service: Gastroenterology;  Laterality: N/A;    Medications:  Current Outpatient Medications on File Prior to Visit  Medication Sig   Multiple Vitamin (MULTIVITAMIN ADULT PO) Take 1 tablet by mouth daily.   No current facility-administered medications on file prior to visit.    Allergies:  No Known Allergies  Social  History:  Social History   Socioeconomic History   Marital status: Single    Spouse name: Not on file   Number of children: Not on file   Years of education: Not on file   Highest education level: Not on file  Occupational History   Not on file  Tobacco Use   Smoking status: Never   Smokeless tobacco: Never  Vaping Use   Vaping Use: Never used  Substance and Sexual Activity   Alcohol use: Never   Drug use: Never   Sexual activity: Yes  Other Topics Concern   Not on file  Social History Narrative   Not on file   Social Determinants of Health   Financial Resource Strain: Not on file  Food Insecurity: Not on file  Transportation Needs: Not on file  Physical Activity: Not on file  Stress: Not on file  Social Connections: Not on file  Intimate Partner Violence: Not on file   Social History   Tobacco Use  Smoking Status Never  Smokeless Tobacco Never   Social History   Substance and Sexual Activity  Alcohol Use Never    Family History:  Family History  Problem Relation Age  of Onset   Cancer Mother    Diabetes Father     Past medical history, surgical history, medications, allergies, family history and social history reviewed with patient today and changes made to appropriate areas of the chart.   Review of Systems  Eyes:  Negative for blurred vision and double vision.  Respiratory:  Negative for shortness of breath.   Cardiovascular:  Negative for chest pain, palpitations and leg swelling.  Neurological:  Negative for dizziness and headaches.   All other ROS negative except what is listed above and in the HPI.      Objective:    BP 107/70   Pulse 67   Temp 98.3 F (36.8 C) (Oral)   Wt 210 lb 4.8 oz (95.4 kg)   SpO2 98%   BMI 31.06 kg/m   Wt Readings from Last 3 Encounters:  01/30/23 210 lb 4.8 oz (95.4 kg)  06/06/22 211 lb (95.7 kg)  02/07/22 215 lb (97.5 kg)    Physical Exam Vitals and nursing note reviewed.  Constitutional:      General:  He is not in acute distress.    Appearance: Normal appearance. He is not ill-appearing, toxic-appearing or diaphoretic.  HENT:     Head: Normocephalic.     Right Ear: Tympanic membrane, ear canal and external ear normal.     Left Ear: Tympanic membrane, ear canal and external ear normal.     Nose: Nose normal. No congestion or rhinorrhea.     Mouth/Throat:     Mouth: Mucous membranes are moist.  Eyes:     General:        Right eye: No discharge.        Left eye: No discharge.     Extraocular Movements: Extraocular movements intact.     Conjunctiva/sclera: Conjunctivae normal.     Pupils: Pupils are equal, round, and reactive to light.  Cardiovascular:     Rate and Rhythm: Normal rate and regular rhythm.     Heart sounds: No murmur heard. Pulmonary:     Effort: Pulmonary effort is normal. No respiratory distress.     Breath sounds: Normal breath sounds. No wheezing, rhonchi or rales.  Abdominal:     General: Abdomen is flat. Bowel sounds are normal. There is no distension.     Palpations: Abdomen is soft.     Tenderness: There is no abdominal tenderness. There is no guarding.  Musculoskeletal:     Cervical back: Normal range of motion and neck supple.  Skin:    General: Skin is warm and dry.     Capillary Refill: Capillary refill takes less than 2 seconds.  Neurological:     General: No focal deficit present.     Mental Status: He is alert and oriented to person, place, and time.     Cranial Nerves: No cranial nerve deficit.     Motor: No weakness.     Deep Tendon Reflexes: Reflexes normal.  Psychiatric:        Mood and Affect: Mood normal.        Behavior: Behavior normal.        Thought Content: Thought content normal.        Judgment: Judgment normal.     Results for orders placed or performed in visit on 01/23/23  CMP12+LP+TP+TSH+6AC+PSA+CBC.  Result Value Ref Range   Glucose 96 70 - 99 mg/dL   Uric Acid 6.9 3.8 - 8.4 mg/dL   BUN 19 6 - 24 mg/dL  Creatinine, Ser  1.27 0.76 - 1.27 mg/dL   eGFR 70 >59 mL/min/1.73   BUN/Creatinine Ratio 15 9 - 20   Sodium 138 134 - 144 mmol/L   Potassium 4.3 3.5 - 5.2 mmol/L   Chloride 101 96 - 106 mmol/L   Calcium 9.2 8.7 - 10.2 mg/dL   Phosphorus 4.0 2.8 - 4.1 mg/dL   Total Protein 6.6 6.0 - 8.5 g/dL   Albumin 4.0 (L) 4.1 - 5.1 g/dL   Globulin, Total 2.6 1.5 - 4.5 g/dL   Albumin/Globulin Ratio 1.5 1.2 - 2.2   Bilirubin Total 0.3 0.0 - 1.2 mg/dL   Alkaline Phosphatase 85 44 - 121 IU/L   LDH 231 (H) 121 - 224 IU/L   AST 23 0 - 40 IU/L   ALT 36 0 - 44 IU/L   GGT 29 0 - 65 IU/L   Iron 86 38 - 169 ug/dL   Cholesterol, Total 195 100 - 199 mg/dL   Triglycerides 56 0 - 149 mg/dL   HDL 48 >39 mg/dL   VLDL Cholesterol Cal 10 5 - 40 mg/dL   LDL Chol Calc (NIH) 137 (H) 0 - 99 mg/dL   Chol/HDL Ratio 4.1 0.0 - 5.0 ratio   Estimated CHD Risk 0.8 0.0 - 1.0 times avg.   TSH 3.110 0.450 - 4.500 uIU/mL   T4, Total 6.4 4.5 - 12.0 ug/dL   T3 Uptake Ratio 28 24 - 39 %   Free Thyroxine Index 1.8 1.2 - 4.9   Prostate Specific Ag, Serum 2.9 0.0 - 4.0 ng/mL   WBC 7.4 3.4 - 10.8 x10E3/uL   RBC 4.71 4.14 - 5.80 x10E6/uL   Hemoglobin 14.3 13.0 - 17.7 g/dL   Hematocrit 41.8 37.5 - 51.0 %   MCV 89 79 - 97 fL   MCH 30.4 26.6 - 33.0 pg   MCHC 34.2 31.5 - 35.7 g/dL   RDW 13.9 11.6 - 15.4 %   Platelets 262 150 - 450 x10E3/uL   Neutrophils 62 Not Estab. %   Lymphs 25 Not Estab. %   Monocytes 9 Not Estab. %   Eos 2 Not Estab. %   Basos 1 Not Estab. %   Neutrophils Absolute 4.6 1.4 - 7.0 x10E3/uL   Lymphocytes Absolute 1.8 0.7 - 3.1 x10E3/uL   Monocytes Absolute 0.7 0.1 - 0.9 x10E3/uL   EOS (ABSOLUTE) 0.2 0.0 - 0.4 x10E3/uL   Basophils Absolute 0.1 0.0 - 0.2 x10E3/uL   Immature Granulocytes 1 Not Estab. %   Immature Grans (Abs) 0.1 0.0 - 0.1 x10E3/uL      Assessment & Plan:   Problem List Items Addressed This Visit       Endocrine   Impaired fasting glucose (pre-diabetes)     Labs reviewed from employer.  Glucose was  96.         Other   Hyperlipidemia    Reviewed labs from employer.  Discussed low fat diet and exercise.       Other Visit Diagnoses     Annual physical exam    -  Primary   Health maintenance reviewed during visit today.  Reviewed labs done on February 1 from employer.  Colonoscopy up to date. Declined Flu shot. TDAP up to date.        Discussed aspirin prophylaxis for myocardial infarction prevention and decision was it was not indicated  LABORATORY TESTING:  Health maintenance labs ordered today as discussed above.   The natural history of prostate cancer and ongoing  controversy regarding screening and potential treatment outcomes of prostate cancer has been discussed with the patient. The meaning of a false positive PSA and a false negative PSA has been discussed. He indicates understanding of the limitations of this screening test and wishes to proceed with screening PSA testing.   IMMUNIZATIONS:   - Tdap: Tetanus vaccination status reviewed: last tetanus booster within 10 years. - Influenza: Refused - Pneumovax: Not applicable - Prevnar: Not applicable - COVID: Not applicable - HPV: Not applicable - Shingrix vaccine: Not applicable  SCREENING: - Colonoscopy: Up to date  Discussed with patient purpose of the colonoscopy is to detect colon cancer at curable precancerous or early stages   - AAA Screening: Not applicable  -Hearing Test: Not applicable  -Spirometry: Not applicable   PATIENT COUNSELING:    Sexuality: Discussed sexually transmitted diseases, partner selection, use of condoms, avoidance of unintended pregnancy  and contraceptive alternatives.   Advised to avoid cigarette smoking.  I discussed with the patient that most people either abstain from alcohol or drink within safe limits (<=14/week and <=4 drinks/occasion for males, <=7/weeks and <= 3 drinks/occasion for females) and that the risk for alcohol disorders and other health effects rises  proportionally with the number of drinks per week and how often a drinker exceeds daily limits.  Discussed cessation/primary prevention of drug use and availability of treatment for abuse.   Diet: Encouraged to adjust caloric intake to maintain  or achieve ideal body weight, to reduce intake of dietary saturated fat and total fat, to limit sodium intake by avoiding high sodium foods and not adding table salt, and to maintain adequate dietary potassium and calcium preferably from fresh fruits, vegetables, and low-fat dairy products.    stressed the importance of regular exercise  Injury prevention: Discussed safety belts, safety helmets, smoke detector, smoking near bedding or upholstery.   Dental health: Discussed importance of regular tooth brushing, flossing, and dental visits.   Follow up plan: NEXT PREVENTATIVE PHYSICAL DUE IN 1 YEAR. Return in about 1 year (around 01/31/2024) for Physical and Fasting labs.

## 2023-12-26 ENCOUNTER — Other Ambulatory Visit: Payer: Self-pay | Admitting: Ophthalmology

## 2023-12-26 DIAGNOSIS — Q111 Other anophthalmos: Secondary | ICD-10-CM | POA: Diagnosis not present

## 2024-01-01 ENCOUNTER — Ambulatory Visit: Admission: RE | Admit: 2024-01-01 | Payer: 59 | Source: Ambulatory Visit

## 2024-01-06 ENCOUNTER — Encounter: Payer: Self-pay | Admitting: Nurse Practitioner

## 2024-01-06 ENCOUNTER — Ambulatory Visit (INDEPENDENT_AMBULATORY_CARE_PROVIDER_SITE_OTHER): Payer: 59 | Admitting: Nurse Practitioner

## 2024-01-06 VITALS — BP 161/91 | HR 65 | Temp 98.5°F | Ht 69.25 in | Wt 219.0 lb

## 2024-01-06 DIAGNOSIS — H579 Unspecified disorder of eye and adnexa: Secondary | ICD-10-CM | POA: Insufficient documentation

## 2024-01-06 DIAGNOSIS — K649 Unspecified hemorrhoids: Secondary | ICD-10-CM | POA: Diagnosis not present

## 2024-01-06 DIAGNOSIS — E782 Mixed hyperlipidemia: Secondary | ICD-10-CM

## 2024-01-06 DIAGNOSIS — R7301 Impaired fasting glucose: Secondary | ICD-10-CM | POA: Diagnosis not present

## 2024-01-06 DIAGNOSIS — R030 Elevated blood-pressure reading, without diagnosis of hypertension: Secondary | ICD-10-CM | POA: Insufficient documentation

## 2024-01-06 MED ORDER — PREPARATION H 0.25-50 % EX GEL: 1.0000 | Freq: Every day | CUTANEOUS | 1 refills | Status: DC

## 2024-01-06 NOTE — Assessment & Plan Note (Signed)
 Followed by [facility].  CT was ordered for evaluation but denied by insurance.  Recommend following up with [facility] and requesting a Peer to Peer to be done.

## 2024-01-06 NOTE — Assessment & Plan Note (Signed)
 Patient gets labs done with city of [place].  Plans to get an A1c checked and bring to next visit.

## 2024-01-06 NOTE — Assessment & Plan Note (Signed)
 Reviewed from [facility] [place] labs from last February.  Patient will have updated labs done and bring them to next visit.  Follow up in 1 month.

## 2024-01-06 NOTE — Assessment & Plan Note (Signed)
 Elevated at visit today.  He is stressed about his imaging for his eye lid not being approved.  Will recheck at next visit and discuss medication if needed at that time.

## 2024-01-06 NOTE — Progress Notes (Signed)
 BP (!) 161/91 (BP Location: Left Arm, Patient Position: Sitting, Cuff Size: Large)   Pulse 65   Temp 98.5 F (36.9 C) (Oral)   Ht 5' 9.25 (1.759 m)   Wt 219 lb (99.3 kg)   SpO2 97%   BMI 32.11 kg/m    Subjective:    Patient ID: Connor Rodriguez, male    DOB: 1976/12/05, 48 y.o.   MRN: [id number]  HPI: Connor Rodriguez is a 48 y.o. male  Chief Complaint  Patient presents with   Referral    Ophthalmologist     Hemorrhoids    2-3 weeks, straining when working out    HYPERTENSION / HYPERLIPIDEMIA Satisfied with current treatment? yes Duration of hypertension: years BP monitoring frequency: not checking BP range:  BP medication side effects: no Past BP meds: none Duration of hyperlipidemia: years Cholesterol medication side effects: no Cholesterol supplements: none Past cholesterol medications: none Medication compliance: excellent compliance Aspirin: no Recent stressors: no Recurrent headaches: no Visual changes: no Palpitations: no Dyspnea: no Chest pain: no Lower extremity edema: no Dizzy/lightheaded: no   Patient states his left eyelid contracts and stays down for about 10 seconds.  He was sent to [facility] to have imaging done but his insurance wouldn't pay for it.    Patient states he is having problems with hemorrhoids.  He was taking a rectal cream and it does help but then it comes back.   Relevant past medical, surgical, family and social history reviewed and updated as indicated. Interim medical history since our last visit reviewed. Allergies and medications reviewed and updated.  Review of Systems  Eyes:  Negative for visual disturbance.       Eye lid problem  Respiratory:  Negative for shortness of breath.   Cardiovascular:  Negative for chest pain and leg swelling.  Gastrointestinal:        Hemorrhoid   Neurological:  Negative for light-headedness and headaches.    Per HPI unless specifically indicated above     Objective:    BP (!) 161/91  (BP Location: Left Arm, Patient Position: Sitting, Cuff Size: Large)   Pulse 65   Temp 98.5 F (36.9 C) (Oral)   Ht 5' 9.25 (1.759 m)   Wt 219 lb (99.3 kg)   SpO2 97%   BMI 32.11 kg/m   Wt Readings from Last 3 Encounters:  01/06/24 219 lb (99.3 kg)  01/30/23 210 lb 4.8 oz (95.4 kg)  06/06/22 211 lb (95.7 kg)    Physical Exam Vitals and nursing note reviewed.  Constitutional:      General: He is not in acute distress.    Appearance: Normal appearance. He is not ill-appearing, toxic-appearing or diaphoretic.  HENT:     Head: Normocephalic.     Right Ear: External ear normal.     Left Ear: External ear normal.     Nose: Nose normal. No congestion or rhinorrhea.     Mouth/Throat:     Mouth: Mucous membranes are moist.  Eyes:     General:        Right eye: No discharge.        Left eye: No discharge.     Extraocular Movements: Extraocular movements intact.     Conjunctiva/sclera: Conjunctivae normal.     Pupils: Pupils are equal, round, and reactive to light.  Cardiovascular:     Rate and Rhythm: Normal rate and regular rhythm.     Heart sounds: No murmur heard. Pulmonary:  Effort: Pulmonary effort is normal. No respiratory distress.     Breath sounds: Normal breath sounds. No wheezing, rhonchi or rales.  Abdominal:     General: Abdomen is flat. Bowel sounds are normal.  Musculoskeletal:     Cervical back: Normal range of motion and neck supple.  Skin:    General: Skin is warm and dry.     Capillary Refill: Capillary refill takes less than 2 seconds.  Neurological:     General: No focal deficit present.     Mental Status: He is alert and oriented to person, place, and time.  Psychiatric:        Mood and Affect: Mood normal.        Behavior: Behavior normal.        Thought Content: Thought content normal.        Judgment: Judgment normal.     Results for orders placed or performed in visit on 01/23/23  CMP12+LP+TP+TSH+6AC+PSA+CBC.   Collection Time: 01/23/23   8:55 AM  Result Value Ref Range   Glucose 96 70 - 99 mg/dL   Uric Acid 6.9 3.8 - 8.4 mg/dL   BUN 19 6 - 24 mg/dL   Creatinine, Ser 8.72 0.76 - 1.27 mg/dL   eGFR 70 >40 fO/fpw/8.26   BUN/Creatinine Ratio 15 9 - 20   Sodium 138 134 - 144 mmol/L   Potassium 4.3 3.5 - 5.2 mmol/L   Chloride 101 96 - 106 mmol/L   Calcium 9.2 8.7 - 10.2 mg/dL   Phosphorus 4.0 2.8 - 4.1 mg/dL   Total Protein 6.6 6.0 - 8.5 g/dL   Albumin 4.0 (L) 4.1 - 5.1 g/dL   Globulin, Total 2.6 1.5 - 4.5 g/dL   Albumin/Globulin Ratio 1.5 1.2 - 2.2   Bilirubin Total 0.3 0.0 - 1.2 mg/dL   Alkaline Phosphatase 85 44 - 121 IU/L   LDH 231 (H) 121 - 224 IU/L   AST 23 0 - 40 IU/L   ALT 36 0 - 44 IU/L   GGT 29 0 - 65 IU/L   Iron 86 38 - 169 ug/dL   Cholesterol, Total 804 100 - 199 mg/dL   Triglycerides 56 0 - 149 mg/dL   HDL 48 >60 mg/dL   VLDL Cholesterol Cal 10 5 - 40 mg/dL   LDL Chol Calc (NIH) 862 (H) 0 - 99 mg/dL   Chol/HDL Ratio 4.1 0.0 - 5.0 ratio   Estimated CHD Risk 0.8 0.0 - 1.0 times avg.   TSH 3.110 0.450 - 4.500 uIU/mL   T4, Total 6.4 4.5 - 12.0 ug/dL   T3 Uptake Ratio 28 24 - 39 %   Free Thyroxine Index 1.8 1.2 - 4.9   Prostate Specific Ag, Serum 2.9 0.0 - 4.0 ng/mL   WBC 7.4 3.4 - 10.8 x10E3/uL   RBC 4.71 4.14 - 5.80 x10E6/uL   Hemoglobin 14.3 13.0 - 17.7 g/dL   Hematocrit 58.1 62.4 - 51.0 %   MCV 89 79 - 97 fL   MCH 30.4 26.6 - 33.0 pg   MCHC 34.2 31.5 - 35.7 g/dL   RDW 86.0 88.3 - 84.5 %   Platelets 262 150 - 450 x10E3/uL   Neutrophils 62 Not Estab. %   Lymphs 25 Not Estab. %   Monocytes 9 Not Estab. %   Eos 2 Not Estab. %   Basos 1 Not Estab. %   Neutrophils Absolute 4.6 1.4 - 7.0 x10E3/uL   Lymphocytes Absolute 1.8 0.7 - 3.1 x10E3/uL   Monocytes  Absolute 0.7 0.1 - 0.9 x10E3/uL   EOS (ABSOLUTE) 0.2 0.0 - 0.4 x10E3/uL   Basophils Absolute 0.1 0.0 - 0.2 x10E3/uL   Immature Granulocytes 1 Not Estab. %   Immature Grans (Abs) 0.1 0.0 - 0.1 x10E3/uL      Assessment & Plan:   Problem List  Items Addressed This Visit       Endocrine   Impaired fasting glucose (pre-diabetes)    Patient gets labs done with city of [place].  Plans to get an A1c checked and bring to next visit.        Other   Hyperlipidemia - Primary   Reviewed from [facility] [place] labs from last February.  Patient will have updated labs done and bring them to next visit.  Follow up in 1 month.      Eye problem   Followed by [facility].  CT was ordered for evaluation but denied by insurance.  Recommend following up with [facility] and requesting a Peer to Peer to be done.        Elevated blood pressure reading   Elevated at visit today.  He is stressed about his imaging for his eye lid not being approved.  Will recheck at next visit and discuss medication if needed at that time.      Other Visit Diagnoses       Hemorrhoids, unspecified hemorrhoid type       Will treat with preparation H. Recommend a fiber supplement to help with stools.  Also recommend warm bath with epsom salt to help with inflammation.        Follow up plan: No follow-ups on file.

## 2024-01-09 ENCOUNTER — Other Ambulatory Visit: Payer: Self-pay

## 2024-01-09 DIAGNOSIS — Z0000 Encounter for general adult medical examination without abnormal findings: Secondary | ICD-10-CM

## 2024-01-09 NOTE — Progress Notes (Signed)
Pt completed labs for physical,

## 2024-01-10 LAB — CMP12+LP+TP+TSH+6AC+PSA+CBC…
ALT: 32 [IU]/L (ref 0–44)
AST: 27 [IU]/L (ref 0–40)
Albumin: 4.3 g/dL (ref 4.1–5.1)
Alkaline Phosphatase: 87 [IU]/L (ref 44–121)
BUN/Creatinine Ratio: 13 (ref 9–20)
BUN: 16 mg/dL (ref 6–24)
Basophils Absolute: 0 10*3/uL (ref 0.0–0.2)
Basos: 1 %
Bilirubin Total: 0.4 mg/dL (ref 0.0–1.2)
Calcium: 9.2 mg/dL (ref 8.7–10.2)
Chloride: 101 mmol/L (ref 96–106)
Chol/HDL Ratio: 4.2 {ratio} (ref 0.0–5.0)
Cholesterol, Total: 242 mg/dL — ABNORMAL HIGH (ref 100–199)
Creatinine, Ser: 1.21 mg/dL (ref 0.76–1.27)
EOS (ABSOLUTE): 0.2 10*3/uL (ref 0.0–0.4)
Eos: 3 %
Estimated CHD Risk: 0.8 times avg. (ref 0.0–1.0)
Free Thyroxine Index: 1.5 (ref 1.2–4.9)
GGT: 27 [IU]/L (ref 0–65)
Globulin, Total: 2.6 g/dL (ref 1.5–4.5)
Glucose: 105 mg/dL — ABNORMAL HIGH (ref 70–99)
HDL: 57 mg/dL (ref >39)
Hematocrit: 45.4 % (ref 37.5–51.0)
Hemoglobin: 14.9 g/dL (ref 13.0–17.7)
Immature Grans (Abs): 0.1 10*3/uL (ref 0.0–0.1)
Immature Granulocytes: 1 %
Iron: 122 ug/dL (ref 38–169)
LDH: 240 [IU]/L — ABNORMAL HIGH (ref 121–224)
LDL Chol Calc (NIH): 171 mg/dL — ABNORMAL HIGH (ref 0–99)
Lymphocytes Absolute: 1.3 10*3/uL (ref 0.7–3.1)
Lymphs: 18 %
MCH: 30.5 pg (ref 26.6–33.0)
MCHC: 32.8 g/dL (ref 31.5–35.7)
MCV: 93 fL (ref 79–97)
Monocytes Absolute: 0.7 10*3/uL (ref 0.1–0.9)
Monocytes: 9 %
Neutrophils Absolute: 5 10*3/uL (ref 1.4–7.0)
Neutrophils: 68 %
Phosphorus: 3.3 mg/dL (ref 2.8–4.1)
Platelets: 199 10*3/uL (ref 150–450)
Potassium: 4.3 mmol/L (ref 3.5–5.2)
Prostate Specific Ag, Serum: 1.4 ng/mL (ref 0.0–4.0)
RBC: 4.88 x10E6/uL (ref 4.14–5.80)
RDW: 14 % (ref 11.6–15.4)
Sodium: 140 mmol/L (ref 134–144)
T3 Uptake Ratio: 26 % (ref 24–39)
T4, Total: 5.9 ug/dL (ref 4.5–12.0)
TSH: 1.42 u[IU]/mL (ref 0.450–4.500)
Total Protein: 6.9 g/dL (ref 6.0–8.5)
Triglycerides: 79 mg/dL (ref 0–149)
Uric Acid: 7 mg/dL (ref 3.8–8.4)
VLDL Cholesterol Cal: 14 mg/dL (ref 5–40)
WBC: 7.3 10*3/uL (ref 3.4–10.8)
eGFR: 73 mL/min/{1.73_m2} (ref >59)

## 2024-01-10 LAB — HGB A1C W/O EAG: Hgb A1c MFr Bld: 6 % — ABNORMAL HIGH (ref 4.8–5.6)

## 2024-01-12 ENCOUNTER — Ambulatory Visit: Payer: 59 | Admitting: Nurse Practitioner

## 2024-01-15 ENCOUNTER — Encounter: Payer: Self-pay | Admitting: Ophthalmology

## 2024-01-20 ENCOUNTER — Inpatient Hospital Stay: Admission: RE | Admit: 2024-01-20 | Payer: 59 | Source: Ambulatory Visit

## 2024-01-26 ENCOUNTER — Ambulatory Visit
Admission: RE | Admit: 2024-01-26 | Discharge: 2024-01-26 | Disposition: A | Discharge status: Home / Self Care | Payer: 59 | Source: Ambulatory Visit | Attending: Ophthalmology

## 2024-01-26 DIAGNOSIS — J320 Chronic maxillary sinusitis: Secondary | ICD-10-CM | POA: Diagnosis not present

## 2024-01-26 DIAGNOSIS — Q111 Other anophthalmos: Secondary | ICD-10-CM

## 2024-02-03 ENCOUNTER — Encounter: Payer: 59 | Admitting: Nurse Practitioner

## 2024-02-10 ENCOUNTER — Encounter: Payer: Self-pay | Admitting: Nurse Practitioner

## 2024-02-10 ENCOUNTER — Ambulatory Visit: Payer: 59 | Admitting: Nurse Practitioner

## 2024-02-10 VITALS — BP 150/91 | HR 74 | Ht 69.0 in | Wt 220.0 lb

## 2024-02-10 DIAGNOSIS — R7301 Impaired fasting glucose: Secondary | ICD-10-CM | POA: Diagnosis not present

## 2024-02-10 DIAGNOSIS — I10 Essential (primary) hypertension: Secondary | ICD-10-CM | POA: Diagnosis not present

## 2024-02-10 DIAGNOSIS — Z23 Encounter for immunization: Secondary | ICD-10-CM

## 2024-02-10 DIAGNOSIS — E782 Mixed hyperlipidemia: Secondary | ICD-10-CM

## 2024-02-10 DIAGNOSIS — Z0000 Encounter for general adult medical examination without abnormal findings: Secondary | ICD-10-CM

## 2024-02-10 NOTE — Assessment & Plan Note (Signed)
A1c remains in the prediabetic range at 6.0%.

## 2024-02-10 NOTE — Assessment & Plan Note (Signed)
Chronic. Not well controlled.  Discussed medication with patient at visit today.  Patient aware of long term effects of uncontrolled blood pressure.  Will work on diet and exercise. Will follow up in 3 months.  Call sooner if concerns arise.

## 2024-02-10 NOTE — Assessment & Plan Note (Signed)
Reviewed labs patient had drawn with the city. Reviewed ASCVD risk score with patient during visit.    The 10-year ASCVD risk score (Arnett DK, et al., 2019) is: 6.8%   Values used to calculate the score:     Age: 48 years     Sex: Male     Is Non-Hispanic African American: Yes     Diabetic: No     Tobacco smoker: No     Systolic Blood Pressure: 150 mmHg     Is BP treated: No     HDL Cholesterol: 57 mg/dL     Total Cholesterol: 242 mg/dL

## 2024-02-10 NOTE — Progress Notes (Signed)
BP (!) 150/91 (BP Location: Right Arm, Patient Position: Sitting, Cuff Size: Normal)   Pulse 74   Ht 5\' 9"  (1.753 m)   Wt 220 lb (99.8 kg)   SpO2 97%   BMI 32.49 kg/m    Subjective:    Patient ID: Connor Rodriguez, male    DOB: 1976-08-03, 48 y.o.   MRN: [id number]  HPI: Connor Rodriguez is a 48 y.o. male presenting on 02/10/2024 for comprehensive medical examination. Current medical complaints include:none  He currently lives with: Interim Problems from his last visit: no  HYPERTENSION without Chronic Kidney Disease Hypertension status: uncontrolled  Satisfied with current treatment? no Duration of hypertension: years BP monitoring frequency:  not checking BP range:  BP medication side effects:  no Medication compliance: excellent compliance Previous BP meds:none Aspirin: no Recurrent headaches: no Visual changes: no Palpitations: no Dyspnea: no Chest pain: no Lower extremity edema: no Dizzy/lightheaded: no  The 10-year ASCVD risk score (Arnett DK, et al., 2019) is: 6.8%   Values used to calculate the score:     Age: 89 years     Sex: Male     Is Non-Hispanic African American: Yes     Diabetic: No     Tobacco smoker: No     Systolic Blood Pressure: 150 mmHg     Is BP treated: No     HDL Cholesterol: 57 mg/dL     Total Cholesterol: 242 mg/dL   Depression Screen done today and results listed below:     02/10/2024    1:23 PM 01/06/2024    8:21 AM 01/30/2023    2:04 PM 01/11/2022    1:35 PM 06/21/2021    1:51 PM  Depression screen PHQ 2/9  Decreased Interest 0 0 0 1 0  Down, Depressed, Hopeless 0 0 0 1 0  PHQ - 2 Score 0 0 0 2 0  Altered sleeping 1 0 1 1 2   Tired, decreased energy 1 0 1 1 1   Change in appetite 0 0 1 0 0  Feeling bad or failure about yourself  0 0 0 0 0  Trouble concentrating 0 0 1 1 0  Moving slowly or fidgety/restless 0 0 0 0 0  Suicidal thoughts 0 0 0 0 0  PHQ-9 Score 2 0 4 5 3   Difficult doing work/chores Not difficult at all  Not  difficult at all Not difficult at all     The patient does not have a history of falls. I did complete a risk assessment for falls. A plan of care for falls was documented.   Past Medical History:  Past Medical History:  Diagnosis Date   Allergic rhinitis 11/06/2020   Allergies    Hyperlipidemia     Surgical History:  Past Surgical History:  Procedure Laterality Date   COLONOSCOPY WITH PROPOFOL N/A 02/07/2022   Procedure: COLONOSCOPY WITH PROPOFOL;  Surgeon: Wyline Mood, MD;  Location: [facility] ENDOSCOPY;  Service: Gastroenterology;  Laterality: N/A;    Medications:  Current Outpatient Medications on File Prior to Visit  Medication Sig   Multiple Vitamin (MULTIVITAMIN ADULT PO) Take 1 tablet by mouth daily.   No current facility-administered medications on file prior to visit.    Allergies:  No Known Allergies  Social History:  Social History   Socioeconomic History   Marital status: Single    Spouse name: Not on file   Number of children: Not on file   Years of education: Not on file  Highest education level: Not on file  Occupational History   Not on file  Tobacco Use   Smoking status: Never   Smokeless tobacco: Never  Vaping Use   Vaping status: Never Used  Substance and Sexual Activity   Alcohol use: Never   Drug use: Never   Sexual activity: Yes  Other Topics Concern   Not on file  Social History Narrative   Not on file   Social Drivers of Health   Financial Resource Strain: Not on file  Food Insecurity: Not on file  Transportation Needs: Not on file  Physical Activity: Not on file  Stress: Not on file  Social Connections: Not on file  Intimate Partner Violence: Not on file   Social History   Tobacco Use  Smoking Status Never  Smokeless Tobacco Never   Social History   Substance and Sexual Activity  Alcohol Use Never    Family History:  Family History  Problem Relation Age of Onset   Cancer Mother    Diabetes Father     Past  medical history, surgical history, medications, allergies, family history and social history reviewed with patient today and changes made to appropriate areas of the chart.   Review of Systems  Eyes:  Negative for blurred vision and double vision.  Respiratory:  Negative for shortness of breath.   Cardiovascular:  Negative for chest pain, palpitations and leg swelling.  Neurological:  Negative for dizziness and headaches.   All other ROS negative except what is listed above and in the HPI.      Objective:    BP (!) 150/91 (BP Location: Right Arm, Patient Position: Sitting, Cuff Size: Normal)   Pulse 74   Ht 5\' 9"  (1.753 m)   Wt 220 lb (99.8 kg)   SpO2 97%   BMI 32.49 kg/m   Wt Readings from Last 3 Encounters:  02/10/24 220 lb (99.8 kg)  01/06/24 219 lb (99.3 kg)  01/30/23 210 lb 4.8 oz (95.4 kg)    Physical Exam Vitals and nursing note reviewed.  Constitutional:      General: He is not in acute distress.    Appearance: Normal appearance. He is not ill-appearing, toxic-appearing or diaphoretic.  HENT:     Head: Normocephalic.     Right Ear: Tympanic membrane, ear canal and external ear normal.     Left Ear: Tympanic membrane, ear canal and external ear normal.     Nose: Nose normal. No congestion or rhinorrhea.     Mouth/Throat:     Mouth: Mucous membranes are moist.  Eyes:     General:        Right eye: No discharge.        Left eye: No discharge.     Extraocular Movements: Extraocular movements intact.     Conjunctiva/sclera: Conjunctivae normal.     Pupils: Pupils are equal, round, and reactive to light.  Cardiovascular:     Rate and Rhythm: Normal rate and regular rhythm.     Heart sounds: No murmur heard. Pulmonary:     Effort: Pulmonary effort is normal. No respiratory distress.     Breath sounds: Normal breath sounds. No wheezing, rhonchi or rales.  Abdominal:     General: Abdomen is flat. Bowel sounds are normal. There is no distension.     Palpations:  Abdomen is soft.     Tenderness: There is no abdominal tenderness. There is no guarding.  Musculoskeletal:     Cervical back: Normal range of  motion and neck supple.  Skin:    General: Skin is warm and dry.     Capillary Refill: Capillary refill takes less than 2 seconds.  Neurological:     General: No focal deficit present.     Mental Status: He is alert and oriented to person, place, and time.     Cranial Nerves: No cranial nerve deficit.     Motor: No weakness.     Deep Tendon Reflexes: Reflexes normal.  Psychiatric:        Mood and Affect: Mood normal.        Behavior: Behavior normal.        Thought Content: Thought content normal.        Judgment: Judgment normal.     Results for orders placed or performed in visit on 01/09/24  Male Executive Panel   Collection Time: 01/09/24  9:40 AM  Result Value Ref Range   Glucose 105 (H) 70 - 99 mg/dL   Uric Acid 7.0 3.8 - 8.4 mg/dL   BUN 16 6 - 24 mg/dL   Creatinine, Ser 1.61 0.76 - 1.27 mg/dL   eGFR 73 >09 UE/AVW/0.98   BUN/Creatinine Ratio 13 9 - 20   Sodium 140 134 - 144 mmol/L   Potassium 4.3 3.5 - 5.2 mmol/L   Chloride 101 96 - 106 mmol/L   Calcium 9.2 8.7 - 10.2 mg/dL   Phosphorus 3.3 2.8 - 4.1 mg/dL   Total Protein 6.9 6.0 - 8.5 g/dL   Albumin 4.3 4.1 - 5.1 g/dL   Globulin, Total 2.6 1.5 - 4.5 g/dL   Bilirubin Total 0.4 0.0 - 1.2 mg/dL   Alkaline Phosphatase 87 44 - 121 IU/L   LDH 240 (H) 121 - 224 IU/L   AST 27 0 - 40 IU/L   ALT 32 0 - 44 IU/L   GGT 27 0 - 65 IU/L   Iron 122 38 - 169 ug/dL   Cholesterol, Total 119 (H) 100 - 199 mg/dL   Triglycerides 79 0 - 149 mg/dL   HDL 57 >14 mg/dL   VLDL Cholesterol Cal 14 5 - 40 mg/dL   LDL Chol Calc (NIH) 782 (H) 0 - 99 mg/dL   Chol/HDL Ratio 4.2 0.0 - 5.0 ratio   Estimated CHD Risk 0.8 0.0 - 1.0 times avg.   TSH 1.420 0.450 - 4.500 uIU/mL   T4, Total 5.9 4.5 - 12.0 ug/dL   T3 Uptake Ratio 26 24 - 39 %   Free Thyroxine Index 1.5 1.2 - 4.9   Prostate Specific Ag, Serum  1.4 0.0 - 4.0 ng/mL   WBC 7.3 3.4 - 10.8 x10E3/uL   RBC 4.88 4.14 - 5.80 x10E6/uL   Hemoglobin 14.9 13.0 - 17.7 g/dL   Hematocrit 95.6 21.3 - 51.0 %   MCV 93 79 - 97 fL   MCH 30.5 26.6 - 33.0 pg   MCHC 32.8 31.5 - 35.7 g/dL   RDW 08.6 57.8 - 46.9 %   Platelets 199 150 - 450 x10E3/uL   Neutrophils 68 Not Estab. %   Lymphs 18 Not Estab. %   Monocytes 9 Not Estab. %   Eos 3 Not Estab. %   Basos 1 Not Estab. %   Neutrophils Absolute 5.0 1.4 - 7.0 x10E3/uL   Lymphocytes Absolute 1.3 0.7 - 3.1 x10E3/uL   Monocytes Absolute 0.7 0.1 - 0.9 x10E3/uL   EOS (ABSOLUTE) 0.2 0.0 - 0.4 x10E3/uL   Basophils Absolute 0.0 0.0 - 0.2 x10E3/uL  Immature Granulocytes 1 Not Estab. %   Immature Grans (Abs) 0.1 0.0 - 0.1 x10E3/uL  Hgb A1c w/o eAG   Collection Time: 01/09/24  9:40 AM  Result Value Ref Range   Hgb A1c MFr Bld 6.0 (H) 4.8 - 5.6 %      Assessment & Plan:   Problem List Items Addressed This Visit       Cardiovascular and Mediastinum   Hypertension   Chronic. Not well controlled.  Discussed medication with patient at visit today.  Patient aware of long term effects of uncontrolled blood pressure.  Will work on diet and exercise. Will follow up in 3 months.  Call sooner if concerns arise.         Endocrine   Impaired fasting glucose (pre-diabetes)    A1c remains in the prediabetic range at 6.0%.        Other   Hyperlipidemia   Reviewed labs patient had drawn with the city. Reviewed ASCVD risk score with patient during visit.    The 10-year ASCVD risk score (Arnett DK, et al., 2019) is: 6.8%   Values used to calculate the score:     Age: 14 years     Sex: Male     Is Non-Hispanic African American: Yes     Diabetic: No     Tobacco smoker: No     Systolic Blood Pressure: 150 mmHg     Is BP treated: No     HDL Cholesterol: 57 mg/dL     Total Cholesterol: 242 mg/dL       Other Visit Diagnoses       Annual physical exam    -  Primary   Health maintenance reviewed during  visit today.  Labs reviewed from [facility].  Vaccines reviewed     Need for tetanus booster       Relevant Orders   Tdap vaccine greater than or equal to 7yo IM        Discussed aspirin prophylaxis for myocardial infarction prevention and decision was it was not indicated  LABORATORY TESTING:  Health maintenance labs ordered today as discussed above.   The natural history of prostate cancer and ongoing controversy regarding screening and potential treatment outcomes of prostate cancer has been discussed with the patient. The meaning of a false positive PSA and a false negative PSA has been discussed. He indicates understanding of the limitations of this screening test and wishes to proceed with screening PSA testing.   IMMUNIZATIONS:   - Tdap: Tetanus vaccination status reviewed: Given today. - Influenza: Refused - Pneumovax: Not applicable - Prevnar: Not applicable - COVID: Not applicable - HPV: Not applicable - Shingrix vaccine: Not applicable  SCREENING: - Colonoscopy: Not applicable  Discussed with patient purpose of the colonoscopy is to detect colon cancer at curable precancerous or early stages   - AAA Screening: Not applicable  -Hearing Test: Not applicable  -Spirometry: Not applicable   PATIENT COUNSELING:    Sexuality: Discussed sexually transmitted diseases, partner selection, use of condoms, avoidance of unintended pregnancy  and contraceptive alternatives.   Advised to avoid cigarette smoking.  I discussed with the patient that most people either abstain from alcohol or drink within safe limits (<=14/week and <=4 drinks/occasion for males, <=7/weeks and <= 3 drinks/occasion for females) and that the risk for alcohol disorders and other health effects rises proportionally with the number of drinks per week and how often a drinker exceeds daily limits.  Discussed cessation/primary prevention of drug  use and availability of treatment for abuse.   Diet: Encouraged  to adjust caloric intake to maintain  or achieve ideal body weight, to reduce intake of dietary saturated fat and total fat, to limit sodium intake by avoiding high sodium foods and not adding table salt, and to maintain adequate dietary potassium and calcium preferably from fresh fruits, vegetables, and low-fat dairy products.    stressed the importance of regular exercise  Injury prevention: Discussed safety belts, safety helmets, smoke detector, smoking near bedding or upholstery.   Dental health: Discussed importance of regular tooth brushing, flossing, and dental visits.   Follow up plan: NEXT PREVENTATIVE PHYSICAL DUE IN 1 YEAR. No follow-ups on file.

## 2024-02-26 DIAGNOSIS — J320 Chronic maxillary sinusitis: Secondary | ICD-10-CM | POA: Diagnosis not present

## 2024-02-26 DIAGNOSIS — H05421 Enophthalmos due to trauma or surgery, right eye: Secondary | ICD-10-CM | POA: Diagnosis not present

## 2024-03-21 ENCOUNTER — Encounter: Payer: Self-pay | Admitting: Nurse Practitioner

## 2024-03-22 ENCOUNTER — Other Ambulatory Visit: Payer: Self-pay

## 2024-03-23 ENCOUNTER — Other Ambulatory Visit: Payer: Self-pay | Admitting: Physician Assistant

## 2024-03-23 ENCOUNTER — Telehealth: Payer: Self-pay

## 2024-03-23 MED ORDER — MONTELUKAST SODIUM 10 MG PO TABS: 10.0000 mg | ORAL_TABLET | Freq: Every day | ORAL | 3 refills | Status: DC

## 2024-03-25 ENCOUNTER — Ambulatory Visit: Admitting: Nurse Practitioner

## 2024-03-25 NOTE — Telephone Encounter (Signed)
 Message to provider requesting Singulair as used effectively in the past for seasonal allergies not handled by Claritin alone.

## 2024-03-25 NOTE — Progress Notes (Deleted)
 There were no vitals taken for this visit.   Subjective:    Patient ID: Connor Rodriguez, male    DOB: 1976/07/10, 48 y.o.   MRN: [id number]  HPI: Connor Rodriguez is a 48 y.o. male  No chief complaint on file.   Relevant past medical, surgical, family and social history reviewed and updated as indicated. Interim medical history since our last visit reviewed. Allergies and medications reviewed and updated.  Review of Systems  Per HPI unless specifically indicated above     Objective:    There were no vitals taken for this visit.  Wt Readings from Last 3 Encounters:  02/10/24 220 lb (99.8 kg)  01/06/24 219 lb (99.3 kg)  01/30/23 210 lb 4.8 oz (95.4 kg)    Physical Exam  Results for orders placed or performed in visit on 01/09/24  Male Executive Panel   Collection Time: 01/09/24  9:40 AM  Result Value Ref Range   Glucose 105 (H) 70 - 99 mg/dL   Uric Acid 7.0 3.8 - 8.4 mg/dL   BUN 16 6 - 24 mg/dL   Creatinine, Ser 8.41 0.76 - 1.27 mg/dL   eGFR 73 >32 GM/WNU/2.72   BUN/Creatinine Ratio 13 9 - 20   Sodium 140 134 - 144 mmol/L   Potassium 4.3 3.5 - 5.2 mmol/L   Chloride 101 96 - 106 mmol/L   Calcium 9.2 8.7 - 10.2 mg/dL   Phosphorus 3.3 2.8 - 4.1 mg/dL   Total Protein 6.9 6.0 - 8.5 g/dL   Albumin 4.3 4.1 - 5.1 g/dL   Globulin, Total 2.6 1.5 - 4.5 g/dL   Bilirubin Total 0.4 0.0 - 1.2 mg/dL   Alkaline Phosphatase 87 44 - 121 IU/L   LDH 240 (H) 121 - 224 IU/L   AST 27 0 - 40 IU/L   ALT 32 0 - 44 IU/L   GGT 27 0 - 65 IU/L   Iron 122 38 - 169 ug/dL   Cholesterol, Total 536 (H) 100 - 199 mg/dL   Triglycerides 79 0 - 149 mg/dL   HDL 57 >64 mg/dL   VLDL Cholesterol Cal 14 5 - 40 mg/dL   LDL Chol Calc (NIH) 403 (H) 0 - 99 mg/dL   Chol/HDL Ratio 4.2 0.0 - 5.0 ratio   Estimated CHD Risk 0.8 0.0 - 1.0 times avg.   TSH 1.420 0.450 - 4.500 uIU/mL   T4, Total 5.9 4.5 - 12.0 ug/dL   T3 Uptake Ratio 26 24 - 39 %   Free Thyroxine Index 1.5 1.2 - 4.9   Prostate Specific Ag,  Serum 1.4 0.0 - 4.0 ng/mL   WBC 7.3 3.4 - 10.8 x10E3/uL   RBC 4.88 4.14 - 5.80 x10E6/uL   Hemoglobin 14.9 13.0 - 17.7 g/dL   Hematocrit 47.4 25.9 - 51.0 %   MCV 93 79 - 97 fL   MCH 30.5 26.6 - 33.0 pg   MCHC 32.8 31.5 - 35.7 g/dL   RDW 56.3 87.5 - 64.3 %   Platelets 199 150 - 450 x10E3/uL   Neutrophils 68 Not Estab. %   Lymphs 18 Not Estab. %   Monocytes 9 Not Estab. %   Eos 3 Not Estab. %   Basos 1 Not Estab. %   Neutrophils Absolute 5.0 1.4 - 7.0 x10E3/uL   Lymphocytes Absolute 1.3 0.7 - 3.1 x10E3/uL   Monocytes Absolute 0.7 0.1 - 0.9 x10E3/uL   EOS (ABSOLUTE) 0.2 0.0 - 0.4 x10E3/uL   Basophils Absolute 0.0  0.0 - 0.2 x10E3/uL   Immature Granulocytes 1 Not Estab. %   Immature Grans (Abs) 0.1 0.0 - 0.1 x10E3/uL  Hgb A1c w/o eAG   Collection Time: 01/09/24  9:40 AM  Result Value Ref Range   Hgb A1c MFr Bld 6.0 (H) 4.8 - 5.6 %      Assessment & Plan:   Problem List Items Addressed This Visit   None    Follow up plan: No follow-ups on file.

## 2024-04-12 ENCOUNTER — Other Ambulatory Visit: Payer: Self-pay | Admitting: Physician Assistant

## 2024-04-12 MED ORDER — MONTELUKAST SODIUM 10 MG PO TABS: 10.0000 mg | ORAL_TABLET | Freq: Every day | ORAL | 3 refills | Status: AC

## 2024-04-20 DIAGNOSIS — J3489 Other specified disorders of nose and nasal sinuses: Secondary | ICD-10-CM | POA: Diagnosis not present

## 2024-04-20 DIAGNOSIS — J339 Nasal polyp, unspecified: Secondary | ICD-10-CM | POA: Diagnosis not present

## 2024-04-20 DIAGNOSIS — J310 Chronic rhinitis: Secondary | ICD-10-CM | POA: Diagnosis not present

## 2024-06-02 DIAGNOSIS — J339 Nasal polyp, unspecified: Secondary | ICD-10-CM | POA: Diagnosis not present

## 2024-06-02 DIAGNOSIS — J320 Chronic maxillary sinusitis: Secondary | ICD-10-CM | POA: Diagnosis not present

## 2024-06-02 DIAGNOSIS — J310 Chronic rhinitis: Secondary | ICD-10-CM | POA: Diagnosis not present

## 2024-06-16 DIAGNOSIS — J329 Chronic sinusitis, unspecified: Secondary | ICD-10-CM | POA: Insufficient documentation

## 2024-07-15 DIAGNOSIS — R7303 Prediabetes: Secondary | ICD-10-CM | POA: Diagnosis not present

## 2024-07-15 DIAGNOSIS — I10 Essential (primary) hypertension: Secondary | ICD-10-CM | POA: Diagnosis not present

## 2024-07-15 DIAGNOSIS — E669 Obesity, unspecified: Secondary | ICD-10-CM | POA: Diagnosis not present

## 2024-07-15 DIAGNOSIS — E7800 Pure hypercholesterolemia, unspecified: Secondary | ICD-10-CM | POA: Diagnosis not present

## 2024-07-15 DIAGNOSIS — Z6832 Body mass index (BMI) 32.0-32.9, adult: Secondary | ICD-10-CM | POA: Diagnosis not present

## 2024-10-25 ENCOUNTER — Telehealth: Payer: Self-pay

## 2025-02-10 ENCOUNTER — Encounter
